# Patient Record
Sex: Female | Born: 1960
Health system: Southern US, Community
[De-identification: ages and names within clinical notes are randomized; demographics above are authoritative.]

## PROBLEM LIST (undated history)

## (undated) DIAGNOSIS — D219 Benign neoplasm of connective and other soft tissue, unspecified: Secondary | ICD-10-CM

## (undated) DIAGNOSIS — Z87442 Personal history of urinary calculi: Secondary | ICD-10-CM

## (undated) HISTORY — DX: Benign neoplasm of connective and other soft tissue, unspecified: D21.9

## (undated) HISTORY — DX: Personal history of urinary calculi: Z87.442

## (undated) HISTORY — PX: HAND SURGERY: SHX662

---

## 2002-09-28 ENCOUNTER — Other Ambulatory Visit: Admission: RE | Admit: 2002-09-28 | Discharge: 2002-09-28 | Payer: Self-pay | Admitting: Obstetrics and Gynecology

## 2004-05-17 ENCOUNTER — Other Ambulatory Visit: Admission: RE | Admit: 2004-05-17 | Discharge: 2004-05-17 | Payer: Self-pay | Admitting: Obstetrics and Gynecology

## 2008-10-03 ENCOUNTER — Ambulatory Visit: Payer: Self-pay | Admitting: Obstetrics and Gynecology

## 2008-10-03 ENCOUNTER — Encounter: Payer: Self-pay | Admitting: Obstetrics and Gynecology

## 2008-10-03 ENCOUNTER — Other Ambulatory Visit: Admission: RE | Admit: 2008-10-03 | Discharge: 2008-10-03 | Payer: Self-pay | Admitting: Obstetrics and Gynecology

## 2009-03-21 LAB — CONVERTED CEMR LAB: Pap Smear: NORMAL

## 2010-08-07 ENCOUNTER — Ambulatory Visit: Payer: Self-pay | Admitting: Internal Medicine

## 2010-08-07 DIAGNOSIS — F17200 Nicotine dependence, unspecified, uncomplicated: Secondary | ICD-10-CM | POA: Insufficient documentation

## 2010-08-07 DIAGNOSIS — R05 Cough: Secondary | ICD-10-CM

## 2010-08-07 DIAGNOSIS — R059 Cough, unspecified: Secondary | ICD-10-CM | POA: Insufficient documentation

## 2010-08-07 LAB — CONVERTED CEMR LAB
ALT: 28 units/L (ref 0–35)
Basophils Relative: 0.5 % (ref 0.0–3.0)
Bilirubin, Direct: 0.1 mg/dL (ref 0.0–0.3)
Chloride: 107 meq/L (ref 96–112)
Direct LDL: 133.6 mg/dL
Eosinophils Relative: 0.9 % (ref 0.0–5.0)
HCT: 40.2 % (ref 36.0–46.0)
Hemoglobin: 13.6 g/dL (ref 12.0–15.0)
Lymphs Abs: 1.5 10*3/uL (ref 0.7–4.0)
MCV: 87.5 fL (ref 78.0–100.0)
Monocytes Absolute: 0.5 10*3/uL (ref 0.1–1.0)
Potassium: 3.6 meq/L (ref 3.5–5.1)
RBC: 4.6 M/uL (ref 3.87–5.11)
TSH: 3.83 microintl units/mL (ref 0.35–5.50)
Total CHOL/HDL Ratio: 4
Total Protein: 7.1 g/dL (ref 6.0–8.3)
VLDL: 22.4 mg/dL (ref 0.0–40.0)
WBC: 8 10*3/uL (ref 4.5–10.5)

## 2010-08-08 ENCOUNTER — Telehealth: Payer: Self-pay | Admitting: Internal Medicine

## 2010-08-08 ENCOUNTER — Encounter: Payer: Self-pay | Admitting: Internal Medicine

## 2010-09-11 ENCOUNTER — Encounter: Payer: Self-pay | Admitting: Internal Medicine

## 2010-09-18 ENCOUNTER — Telehealth: Payer: Self-pay | Admitting: Internal Medicine

## 2010-10-01 ENCOUNTER — Encounter: Payer: Self-pay | Admitting: Internal Medicine

## 2010-11-22 NOTE — Letter (Signed)
Summary: [facility]   Imported By: Lester [place] 09/18/2010 12:41:18  _____________________________________________________________________  External Attachment:    Type:   Image     Comment:   External Document

## 2010-11-22 NOTE — Progress Notes (Signed)
Summary: RESULTS  Phone Note Call from Patient   Summary of Call: Patient is requesting results of cxr. Normal correct?  Initial call taken by: Lamar Sprinkles, CMA,  August 08, 2010 3:58 PM  Follow-up for Phone Call        yes Follow-up by: Etta Grandchild MD,  August 08, 2010 4:03 PM  Additional Follow-up for Phone Call Additional follow up Details #1::        Pt informed  Additional Follow-up by: Lamar Sprinkles, CMA,  August 09, 2010 12:11 PM

## 2010-11-22 NOTE — Assessment & Plan Note (Signed)
Summary: new bcbs pt phy-#--pkg---stc   Vital Signs:  Patient profile:   50 year old female Menstrual status:  regular LMP:     07/10/2010 Height:      65 inches Weight:      171 pounds BMI:     28.56 O2 Sat:      98 % on Room air Temp:     98.1 degrees F oral Pulse rate:   88 / minute Pulse rhythm:   regular Resp:     16 per minute BP sitting:   112 / 84  (left arm) Cuff size:   regular  Vitals Entered By: Lanier Prude, Beverly Gust) (August 07, 2010 9:06 AM)  Nutrition Counseling: Patient's BMI is greater than 25 and therefore counseled on weight management options.  O2 Flow:  Room air CC: Est PCP c/o dry cough and chest congestion X 2 wks, URI symptoms, Preventive Care Is Patient Diabetic? No LMP (date): 07/10/2010     Menstrual Status regular Enter LMP: 07/10/2010 Last PAP Result Normal   Primary Care Provider:  Yetta Barre  CC:  Est PCP c/o dry cough and chest congestion X 2 wks, URI symptoms, and Preventive Care.  History of Present Illness:  URI Symptoms      This is a 50 year old woman who presents with URI symptoms.  The symptoms began 2 weeks ago.  The severity is described as moderate.  The patient reports productive cough and sick contacts, but denies nasal congestion, clear nasal discharge, purulent nasal discharge, sore throat, dry cough, and earache.  The patient denies fever, stiff neck, dyspnea, wheezing, rash, vomiting, diarrhea, use of an antipyretic, and response to antipyretic.  The patient denies itchy throat, sneezing, seasonal symptoms, headache, muscle aches, and severe fatigue.  Risk factors for Strep sinusitis include double sickening.  The patient denies the following risk factors for Strep sinusitis: unilateral facial pain, unilateral nasal discharge, poor response to decongestant, tooth pain, Strep exposure, tender adenopathy, and absence of cough.    Preventive Screening-Counseling & Management  Alcohol-Tobacco     Alcohol drinks/day: 1  Alcohol type: wine     >5/day in last 3 mos: no     Alcohol Counseling: not indicated; use of alcohol is not excessive or problematic     Feels need to cut down: no     Feels annoyed by complaints: no     Feels guilty re: drinking: no     Needs 'eye opener' in am: no     Smoking Status: current     Smoking Cessation Counseling: yes     Smoke Cessation Stage: precontemplative     Packs/Day: <0.25     Year Started: 1984     Pack years: 10     Passive Smoke Exposure: yes     Tobacco Counseling: to quit use of tobacco products     Passive Smoke Counseling: to avoid passive smoke exposure  Hep-HIV-STD-Contraception     Hepatitis Risk: no risk noted     HIV Risk: no risk noted     STD Risk: no risk noted     Dental Visit-last 6 months yes     Dental Care Counseling: to seek dental care; no dental care within six months     SBE monthly: yes     SBE Education/Counseling: to perform regular SBE     Sun Exposure-Excessive: no     Sun Exposure Counseling: not indicated; sun exposure is acceptable  Safety-Violence-Falls     Seat  Belt Use: yes     Helmet Use: n/a     Firearms in the Home: firearms in the home     Firearm Counseling: to practice firearm safety     Smoke Detectors: yes     Violence in the Home: no risk noted     Sexual Abuse: no      Sexual History:  currently monogamous.        Drug Use:  never.        Blood Transfusions:  no.    Medications Prior to Update: 1)  None  Current Medications (verified): 1)  Cefuroxime Axetil 500 Mg Tabs (Cefuroxime Axetil) .... One By Mouth Two Times A Day For 10 Days  Allergies (verified): 1)  ! * Iv Contrast  Social History: Smoking Status:  current Risk analyst Use:  yes Packs/Day:  <0.25 Passive Smoke Exposure:  yes Hepatitis Risk:  no risk noted HIV Risk:  no risk noted STD Risk:  no risk noted Dental Care w/in 6 mos.:  yes Sun Exposure-Excessive:  no Sexual History:  currently monogamous Drug Use:  never Blood  Transfusions:  no  Review of Systems       The patient complains of weight gain.  The patient denies anorexia, fever, weight loss, hoarseness, chest pain, syncope, dyspnea on exertion, peripheral edema, headaches, hemoptysis, abdominal pain, melena, hematochezia, severe indigestion/heartburn, hematuria, muscle weakness, suspicious skin lesions, transient blindness, difficulty walking, depression, abnormal bleeding, enlarged lymph nodes, angioedema, and breast masses.    Physical Exam  General:  alert, well-developed, well-nourished, well-hydrated, appropriate dress, normal appearance, healthy-appearing, cooperative to examination, good hygiene, and overweight-appearing.   Head:  normocephalic, atraumatic, no abnormalities observed, and no abnormalities palpated.   Eyes:  vision grossly intact, pupils equal, pupils round, and pupils reactive to light.   Ears:  R ear normal and L ear normal.   Nose:  External nasal examination shows no deformity or inflammation. Nasal mucosa are pink and moist without lesions or exudates. Mouth:  Oral mucosa and oropharynx without lesions or exudates.  Teeth in good repair.no erythema, no exudates, no posterior lymphoid hypertrophy, no postnasal drip, no pharyngeal crowing, no lesions, no tongue abnormalities, no leukoplakia, and no petechiae.   Neck:  supple, full ROM, no masses, no thyromegaly, no thyroid nodules or tenderness, no JVD, normal carotid upstroke, no carotid bruits, no cervical lymphadenopathy, and no neck tenderness.   Lungs:  normal respiratory effort, no intercostal retractions, no accessory muscle use, normal breath sounds, no dullness, no fremitus, no crackles, and no wheezes.   Heart:  normal rate, regular rhythm, no murmur, no gallop, no rub, and no JVD.   Abdomen:  soft, non-tender, normal bowel sounds, no distention, no masses, no guarding, no rigidity, no rebound tenderness, no abdominal hernia, no inguinal hernia, no hepatomegaly, and no  splenomegaly.   Msk:  No deformity or scoliosis noted of thoracic or lumbar spine.   Pulses:  R and L carotid,radial,femoral,dorsalis pedis and posterior tibial pulses are full and equal bilaterally Extremities:  No clubbing, cyanosis, edema, or deformity noted with normal full range of motion of all joints.   Neurologic:  No cranial nerve deficits noted. Station and gait are normal. Plantar reflexes are down-going bilaterally. DTRs are symmetrical throughout. Sensory, motor and coordinative functions appear intact. Skin:  turgor normal, color normal, no rashes, no suspicious lesions, no ecchymoses, no petechiae, no purpura, no ulcerations, and no edema.   Cervical Nodes:  no anterior cervical adenopathy and no posterior  cervical adenopathy.   Axillary Nodes:  no R axillary adenopathy and no L axillary adenopathy.   Inguinal Nodes:  no R inguinal adenopathy and no L inguinal adenopathy.   Psych:  Cognition and judgment appear intact. Alert and cooperative with normal attention span and concentration. No apparent delusions, illusions, hallucinations   Impression & Recommendations:  Problem # 1:  ROUTINE GENERAL MEDICAL EXAM@HEALTH  CARE FACL (ICD-V70.0) Assessment New  Orders: Venipuncture (16109) TLB-Lipid Panel (80061-LIPID) TLB-BMP (Basic Metabolic Panel-BMET) (80048-METABOL) TLB-CBC Platelet - w/Differential (85025-CBCD) TLB-Hepatic/Liver Function Pnl (80076-HEPATIC) TLB-TSH (Thyroid Stimulating Hormone) (60454-UJW) Radiology Referral (Radiology)  Mammogram: normal (03/21/2010) Pap smear: Normal (05/08/2010) Td Booster: Tdap (08/07/2010)    Discussed using sunscreen, use of alcohol, drug use, self breast exam, routine dental care, routine eye care, schedule for GYN exam, routine physical exam, seat belts, multiple vitamins, osteoporosis prevention, adequate calcium intake in diet, recommendations for immunizations, mammograms and Pap smears.  Discussed exercise and checking  cholesterol.   Problem # 2:  BRONCHITIS-ACUTE (ICD-466.0) Assessment: New  Her updated medication list for this problem includes:    Cefuroxime Axetil 500 Mg Tabs (Cefuroxime axetil) ..... One by mouth two times a day for 10 days  Take antibiotics and other medications as directed. Encouraged to push clear liquids, get enough rest, and take acetaminophen as needed. To be seen in 5-7 days if no improvement, sooner if worse.  Problem # 3:  COUGH (ICD-786.2) Assessment: New will look for pna, mass, edema, etc. Orders: T-2 View CXR (71020TC)  Problem # 4:  TOBACCO USE (ICD-305.1) Assessment: New  Encouraged smoking cessation and discussed different methods for smoking cessation.   Complete Medication List: 1)  Cefuroxime Axetil 500 Mg Tabs (Cefuroxime axetil) .... One by mouth two times a day for 10 days  Other Orders: Tdap => 24yrs IM (11914) Admin 1st Vaccine (78295)  PAP Screening:    Hx Cervical Dysplasia in last 5 yrs? No    3 normal PAP smears in last 5 yrs? Yes    Last PAP smear:  05/08/2010    Reviewed PAP smear recommendations:  patient defers to GYN provider  PAP Smear Results:    Date of Exam:  05/08/2010    Results:  Normal  Mammogram Screening:    Last Mammogram:  03/21/2010    Reviewed Mammogram recommendations:  mammogram ordered  Osteoporosis Risk Assessment:  Risk Factors for Fracture or Low Bone Density:   Smoking status:       current  Immunization & Chemoprophylaxis:    Tetanus vaccine: Tdap  (08/07/2010)  Patient Instructions: 1)  Please schedule a follow-up appointment in 1 month. 2)  Tobacco is very bad for your health and your loved ones! You Should stop smoking!. 3)  Stop Smoking Tips: Choose a Quit date. Cut down before the Quit date. decide what you will do as a substitute when you feel the urge to smoke(gum,toothpick,exercise). 4)  It is important that you exercise regularly at least 20 minutes 5 times a week. If you develop chest pain,  have severe difficulty breathing, or feel very tired , stop exercising immediately and seek medical attention. 5)  You need to lose weight. Consider a lower calorie diet and regular exercise.  6)  Schedule your mammogram. 7)  You need to have a Pap Smear to prevent cervical cancer. 8)  Take calcium +Vitamin D daily. 9)  Take your antibiotic as prescribed until ALL of it is gone, but stop if you develop a rash or swelling and  contact our office as soon as possible. 10)  Acute bronchitis symptoms for less than 10 days are not helped by antibiotics. take over the counter cough medications. call if no improvment in  5-7 days, sooner if increasing cough, fever, or new symptoms( shortness of breath, chest pain). Prescriptions: CEFUROXIME AXETIL 500 MG TABS (CEFUROXIME AXETIL) One by mouth two times a day for 10 days  #20 x 1   Entered and Authorized by:   Etta Grandchild MD   Signed by:   Etta Grandchild MD on 08/07/2010   Method used:   Print then Give to Patient   RxID:   [id number]    Orders Added: 1)  T-2 View CXR [71020TC] 2)  Venipuncture [56213] 3)  TLB-Lipid Panel [80061-LIPID] 4)  TLB-BMP (Basic Metabolic Panel-BMET) [80048-METABOL] 5)  TLB-CBC Platelet - w/Differential [85025-CBCD] 6)  TLB-Hepatic/Liver Function Pnl [80076-HEPATIC] 7)  TLB-TSH (Thyroid Stimulating Hormone) [84443-TSH] 8)  Radiology Referral [Radiology] 9)  Tdap => 65yrs IM [90715] 10)  Admin 1st Vaccine [90471] 11)  New Patient 40-64 years [99386] 62)  New Patient Level IV [99204]   Immunizations Administered:  Tetanus Vaccine:    Vaccine Type: Tdap    Site: left deltoid    Mfr: GlaxoSmithKline    Dose: 0.5 ml    Route: IM    Given by: Lanier Prude, CMA(AAMA)    Exp. Date: 08/09/2012    Lot #: [id number]    VIS given: 09/07/08 version given August 07, 2010.   Immunizations Administered:  Tetanus Vaccine:    Vaccine Type: Tdap    Site: left deltoid    Mfr: GlaxoSmithKline    Dose: 0.5  ml    Route: IM    Given by: Lanier Prude, CMA(AAMA)    Exp. Date: 08/09/2012    Lot #: [id number]    VIS given: 09/07/08 version given August 07, 2010.   Preventive Care Screening  Mammogram:    Date:  03/21/2010    Results:  normal   Pap Smear:    Date:  03/21/2009    Results:  normal

## 2010-11-22 NOTE — Letter (Signed)
Summary: Lipid Letter  [facility]  [address] [place], [place] [address]   Phone: [phone]  Fax: [phone]    08/08/2010  Kathy Castro [address] [place], [place]  [address]  Dear Kathy Castro:  We have carefully reviewed your last lipid profile from  and the results are noted below with a summary of recommendations for lipid management.    Cholesterol:       206     Goal: <200   HDL "good" Cholesterol:   95.63     Goal: >40   LDL "bad" Cholesterol:   134     Goal: <130   Triglycerides:       112.0     Goal: <150    Chest Xray and other labs are normal    TLC Diet (Therapeutic Lifestyle Change): Saturated Fats & Transfatty acids should be kept < 7% of total calories ***Reduce Saturated Fats Polyunstaurated Fat can be up to 10% of total calories Monounsaturated Fat Fat can be up to 20% of total calories Total Fat should be no greater than 25-35% of total calories Carbohydrates should be 50-60% of total calories Protein should be approximately 15% of total calories Fiber should be at least 20-30 grams a day ***Increased fiber may help lower LDL Total Cholesterol should be < 200mg /day Consider adding plant stanol/sterols to diet (example: Benacol spread) ***A higher intake of unsaturated fat may reduce Triglycerides and Increase HDL    Adjunctive Measures (may lower LIPIDS and reduce risk of Heart Attack) include: Aerobic Exercise (20-30 minutes 3-4 times a week) Limit Alcohol Consumption Weight Reduction Aspirin 75-81 mg a day by mouth (if not allergic or contraindicated) Dietary Fiber 20-30 grams a day by mouth     Current Medications: 1)    Cefuroxime Axetil 500 Mg Tabs (Cefuroxime axetil) .... One by mouth two times a day for 10 days  If you have any questions, please call. We appreciate being able to work with you.   Sincerely,    [facility]

## 2010-11-22 NOTE — Progress Notes (Signed)
Summary: RESULTS -FYI  Phone Note Call from Patient Call back at Work Phone [phone]   Summary of Call: Patient is requesting a call back. She wants to know what her BP was at last office visit.  Initial call taken by: Lamar Sprinkles, CMA,  September 18, 2010 2:38 PM  Follow-up for Phone Call        Gave pt reading, pt's bp at health screening at wk was 138/80. Advised she continue to monitor and come in for office visit if bp is 140/90 or above. Follow-up by: Lamar Sprinkles, CMA,  September 18, 2010 5:55 PM

## 2011-04-02 LAB — HM PAP SMEAR: HM Pap smear: NORMAL

## 2011-04-09 LAB — HM MAMMOGRAPHY: HM Mammogram: NORMAL

## 2011-04-10 ENCOUNTER — Encounter: Payer: Self-pay | Admitting: Endocrinology

## 2011-06-13 ENCOUNTER — Encounter: Payer: Self-pay | Admitting: Gynecology

## 2011-06-25 ENCOUNTER — Encounter: Payer: Self-pay | Admitting: Obstetrics and Gynecology

## 2011-06-25 ENCOUNTER — Ambulatory Visit (INDEPENDENT_AMBULATORY_CARE_PROVIDER_SITE_OTHER): Payer: BC Managed Care – PPO | Admitting: Obstetrics and Gynecology

## 2011-06-25 ENCOUNTER — Other Ambulatory Visit (HOSPITAL_COMMUNITY)
Admission: RE | Admit: 2011-06-25 | Discharge: 2011-06-25 | Disposition: A | Discharge status: Home / Self Care | Payer: BC Managed Care – PPO | Source: Ambulatory Visit | Attending: Obstetrics and Gynecology | Admitting: Obstetrics and Gynecology

## 2011-06-25 VITALS — BP 116/80 | Ht 65.25 in | Wt 178.0 lb

## 2011-06-25 DIAGNOSIS — Z01419 Encounter for gynecological examination (general) (routine) without abnormal findings: Secondary | ICD-10-CM

## 2011-06-25 DIAGNOSIS — N949 Unspecified condition associated with female genital organs and menstrual cycle: Secondary | ICD-10-CM

## 2011-06-25 DIAGNOSIS — Z87442 Personal history of urinary calculi: Secondary | ICD-10-CM | POA: Insufficient documentation

## 2011-06-25 DIAGNOSIS — N938 Other specified abnormal uterine and vaginal bleeding: Secondary | ICD-10-CM

## 2011-06-25 DIAGNOSIS — R823 Hemoglobinuria: Secondary | ICD-10-CM

## 2011-06-25 NOTE — Progress Notes (Addendum)
Patient came to see me today for an annual GYN exam. She's had some stress over the past year with deaths of both her mother and father. During these periods of time her periods have gotten closer together. She's only had one cycle however that was less than 21 days. That  cycle was 18 days. She also had one cycle when she spotted, had her period, and in spotted some more. The entire length of bleeding that month was 18 days. Other than that she will just have several days of spotting before and after her period. She is up-to-date on mammograms. She sees Dr. Yetta Barre in [facility] and does her lab work with him. She has microscopic hematuria and kidney stones and sees [facility].  Physical examination: HEENT within normal limits. Neck: Thyroid not large. No masses. Supraclavicular nodes: not enlarged. Breasts: Examined in both sitting midline position. No skin changes and no masses. Abdomen: Soft no guarding rebound or masses or hernia. Pelvic: External: Within normal limits. BUS: Within normal limits. Vaginal:within normal limits. Good estrogen effect. No evidence of cystocele rectocele or enterocele. Cervix: clean. Uterus: enlarged by fibroids to 8-9 weeks size. Adnexa: No masses. Rectovaginal exam: Confirmatory and negative. Extremities: Within normal limits.  Assessment: 1. One episode DUB. 2. Kidney stones with microscopic hematuria. 3. Fibroids  Plan: 1. Continue yearly mammograms 2. Call me with any more DUB and we will do an SIH.

## 2011-06-25 NOTE — Patient Instructions (Signed)
Call me if length of bleeding increases.

## 2011-06-25 NOTE — Progress Notes (Signed)
Addended byCammie Mcgee T on: 06/25/2011 10:57 AM   Modules accepted: Orders

## 2011-08-09 ENCOUNTER — Ambulatory Visit (INDEPENDENT_AMBULATORY_CARE_PROVIDER_SITE_OTHER): Payer: BC Managed Care – PPO | Admitting: Internal Medicine

## 2011-08-09 ENCOUNTER — Encounter: Payer: Self-pay | Admitting: Internal Medicine

## 2011-08-09 ENCOUNTER — Other Ambulatory Visit (INDEPENDENT_AMBULATORY_CARE_PROVIDER_SITE_OTHER): Payer: BC Managed Care – PPO

## 2011-08-09 VITALS — BP 110/74 | HR 80 | Temp 97.8°F | Resp 16 | Wt 177.0 lb

## 2011-08-09 DIAGNOSIS — Z0000 Encounter for general adult medical examination without abnormal findings: Secondary | ICD-10-CM

## 2011-08-09 LAB — URINALYSIS, ROUTINE W REFLEX MICROSCOPIC
Bilirubin Urine: NEGATIVE
Ketones, ur: NEGATIVE
Leukocytes, UA: NEGATIVE
Nitrite: NEGATIVE

## 2011-08-09 LAB — LIPID PANEL: Cholesterol: 197 mg/dL (ref 0–200)

## 2011-08-09 LAB — CBC WITH DIFFERENTIAL/PLATELET
Basophils Absolute: 0 10*3/uL (ref 0.0–0.1)
Eosinophils Absolute: 0.1 10*3/uL (ref 0.0–0.7)
HCT: 42.1 % (ref 36.0–46.0)
Lymphs Abs: 1.4 10*3/uL (ref 0.7–4.0)
MCV: 89.1 fl (ref 78.0–100.0)
Monocytes Absolute: 0.6 10*3/uL (ref 0.1–1.0)
Monocytes Relative: 7.5 % (ref 3.0–12.0)
Platelets: 243 10*3/uL (ref 150.0–400.0)
RDW: 14.8 % — ABNORMAL HIGH (ref 11.5–14.6)

## 2011-08-09 LAB — COMPREHENSIVE METABOLIC PANEL
Albumin: 4.2 g/dL (ref 3.5–5.2)
BUN: 15 mg/dL (ref 6–23)
CO2: 27 mEq/L (ref 19–32)
Calcium: 8.9 mg/dL (ref 8.4–10.5)
Chloride: 107 mEq/L (ref 96–112)
Glucose, Bld: 93 mg/dL (ref 70–99)
Potassium: 4 mEq/L (ref 3.5–5.1)

## 2011-08-09 LAB — TSH: TSH: 3.5 u[IU]/mL (ref 0.35–5.50)

## 2011-08-09 NOTE — Patient Instructions (Signed)

## 2011-08-09 NOTE — Progress Notes (Signed)
  Subjective:    Patient ID: Kathy Castro, female    DOB: August 14, 1961, 50 y.o.   MRN: [id number]  HPI She returns for a complete physical and offers no complaints.    Review of Systems  Constitutional: Negative.   HENT: Negative.   Eyes: Negative.   Respiratory: Negative.   Cardiovascular: Negative.   Gastrointestinal: Negative.   Genitourinary: Negative.   Musculoskeletal: Negative.   Skin: Negative.   Neurological: Negative.   Hematological: Negative.   Psychiatric/Behavioral: Negative.        Objective:   Physical Exam  Vitals reviewed. Constitutional: She is oriented to person, place, and time. She appears well-developed. No distress.  HENT:  Head: Normocephalic and atraumatic.  Mouth/Throat: Oropharynx is clear and moist. No oropharyngeal exudate.  Eyes: Conjunctivae are normal. Right eye exhibits no discharge. Left eye exhibits no discharge. No scleral icterus.  Neck: Normal range of motion. Neck supple. No JVD present. No tracheal deviation present. No thyromegaly present.  Cardiovascular: Normal rate, regular rhythm, normal heart sounds and intact distal pulses.  Exam reveals no gallop and no friction rub.   No murmur heard. Pulmonary/Chest: Effort normal and breath sounds normal. No stridor. No respiratory distress. She has no wheezes. She has no rales. She exhibits no tenderness.  Abdominal: Soft. Bowel sounds are normal. She exhibits no distension and no mass. There is no tenderness. There is no rebound and no guarding.  Musculoskeletal: Normal range of motion. She exhibits no edema and no tenderness.  Lymphadenopathy:    She has no cervical adenopathy.  Neurological: She is oriented to person, place, and time. She displays normal reflexes. No cranial nerve deficit. She exhibits normal muscle tone. Coordination normal.  Skin: Skin is warm and dry. No rash noted. She is not diaphoretic. No erythema. No pallor.  Psychiatric: She has a normal mood and affect. Her  behavior is normal. Judgment and thought content normal.          Assessment & Plan:

## 2011-08-09 NOTE — Assessment & Plan Note (Signed)
Exam done, labs ordered, pt ed material was given 

## 2011-08-30 ENCOUNTER — Other Ambulatory Visit: Payer: Self-pay | Admitting: Chiropractic Medicine

## 2011-08-30 ENCOUNTER — Ambulatory Visit
Admission: RE | Admit: 2011-08-30 | Discharge: 2011-08-30 | Disposition: A | Discharge status: Home / Self Care | Payer: BC Managed Care – PPO | Source: Ambulatory Visit | Attending: Chiropractic Medicine | Admitting: Chiropractic Medicine

## 2011-08-30 DIAGNOSIS — M5416 Radiculopathy, lumbar region: Secondary | ICD-10-CM

## 2011-08-30 DIAGNOSIS — M542 Cervicalgia: Secondary | ICD-10-CM

## 2011-08-30 DIAGNOSIS — M5412 Radiculopathy, cervical region: Secondary | ICD-10-CM

## 2011-11-21 ENCOUNTER — Encounter: Payer: Self-pay | Admitting: Internal Medicine

## 2011-11-21 ENCOUNTER — Ambulatory Visit (INDEPENDENT_AMBULATORY_CARE_PROVIDER_SITE_OTHER): Payer: BC Managed Care – PPO | Admitting: Internal Medicine

## 2011-11-21 VITALS — BP 120/78 | HR 95 | Temp 97.8°F | Resp 16 | Wt 179.2 lb

## 2011-11-21 DIAGNOSIS — Z0000 Encounter for general adult medical examination without abnormal findings: Secondary | ICD-10-CM

## 2011-11-21 DIAGNOSIS — J019 Acute sinusitis, unspecified: Secondary | ICD-10-CM

## 2011-11-21 MED ORDER — AMOXICILLIN-POT CLAVULANATE 500-125 MG PO TABS: 1.0000 | ORAL_TABLET | Freq: Three times a day (TID) | ORAL | Status: AC

## 2011-11-21 NOTE — Assessment & Plan Note (Signed)
Start augmentin for the infection 

## 2011-11-21 NOTE — Assessment & Plan Note (Signed)
She will use nicotine patches to try to quit smoking

## 2011-11-21 NOTE — Patient Instructions (Signed)

## 2011-11-21 NOTE — Progress Notes (Signed)
Subjective:    Patient ID: Kathy Castro, female    DOB: 1960-12-16, 51 y.o.   MRN: [id number]  Cough This is a new problem. The current episode started in the past 7 days. The problem has been gradually worsening. The problem occurs constantly. The cough is productive of purulent sputum. Associated symptoms include chills, rhinorrhea and a sore throat. Pertinent negatives include no chest pain, ear congestion, ear pain, fever, headaches, heartburn, hemoptysis, myalgias, nasal congestion, postnasal drip, rash, shortness of breath, sweats, weight loss or wheezing. The symptoms are aggravated by nothing. Risk factors for lung disease include smoking/tobacco exposure. She has tried nothing for the symptoms.      Review of Systems  Constitutional: Positive for chills. Negative for fever, weight loss, diaphoresis, activity change, appetite change, fatigue and unexpected weight change.  HENT: Positive for sore throat, rhinorrhea and sinus pressure. Negative for ear pain, facial swelling, sneezing, trouble swallowing, neck pain, neck stiffness, dental problem, voice change and postnasal drip.   Eyes: Negative.   Respiratory: Positive for cough. Negative for apnea, hemoptysis, choking, chest tightness, shortness of breath, wheezing and stridor.   Cardiovascular: Negative for chest pain, palpitations and leg swelling.  Gastrointestinal: Negative for heartburn, nausea, vomiting, abdominal pain, diarrhea, constipation, blood in stool, abdominal distention, anal bleeding and rectal pain.  Genitourinary: Negative.   Musculoskeletal: Negative for myalgias, back pain, joint swelling, arthralgias and gait problem.  Skin: Negative for color change, pallor, rash and wound.  Neurological: Negative.  Negative for headaches.  Hematological: Negative for adenopathy. Does not bruise/bleed easily.  Psychiatric/Behavioral: Negative.        Objective:   Physical Exam  Vitals reviewed. Constitutional: She is  oriented to person, place, and time. She appears well-developed and well-nourished. No distress.  HENT:  Head: Normocephalic and atraumatic.  Mouth/Throat: Oropharynx is clear and moist. No oropharyngeal exudate.  Eyes: Conjunctivae are normal. Right eye exhibits no discharge. Left eye exhibits no discharge. No scleral icterus.  Neck: Normal range of motion. Neck supple. No JVD present. No tracheal deviation present. No thyromegaly present.  Cardiovascular: Normal rate, regular rhythm, normal heart sounds and intact distal pulses.  Exam reveals no gallop.   No murmur heard. Pulmonary/Chest: Effort normal and breath sounds normal. No stridor. No respiratory distress. She has no wheezes. She has no rales. She exhibits no tenderness.  Abdominal: Soft. Bowel sounds are normal. She exhibits no distension and no mass. There is no tenderness. There is no rebound and no guarding.  Musculoskeletal: Normal range of motion. She exhibits no edema and no tenderness.  Lymphadenopathy:    She has no cervical adenopathy.  Neurological: She is oriented to person, place, and time.  Skin: Skin is warm and dry. No rash noted. She is not diaphoretic. No erythema. No pallor.  Psychiatric: She has a normal mood and affect. Her behavior is normal. Judgment and thought content normal.      Lab Results  Component Value Date   WBC 7.4 08/09/2011   HGB 14.2 08/09/2011   HCT 42.1 08/09/2011   PLT 243.0 08/09/2011   GLUCOSE 93 08/09/2011   CHOL 197 08/09/2011   TRIG 68.0 08/09/2011   HDL 57.20 08/09/2011   LDLDIRECT 133.6 08/07/2010   LDLCALC 126* 08/09/2011   ALT 18 08/09/2011   AST 18 08/09/2011   NA 143 08/09/2011   K 4.0 08/09/2011   CL 107 08/09/2011   CREATININE 0.8 08/09/2011   BUN 15 08/09/2011   CO2 27 08/09/2011   TSH  3.50 08/09/2011      Assessment & Plan:

## 2011-12-11 ENCOUNTER — Telehealth: Payer: Self-pay

## 2011-12-11 MED ORDER — FLUCONAZOLE 150 MG PO TABS: 150.0000 mg | ORAL_TABLET | Freq: Once | ORAL | Status: AC

## 2011-12-11 NOTE — Telephone Encounter (Signed)
Pt called requesting Rx for yeast infection.

## 2011-12-11 NOTE — Telephone Encounter (Signed)
Pt says she has been using OTC meds with no help. Pt was seen 01/31 and Rxd ABX that caused infection, please advise.

## 2011-12-11 NOTE — Telephone Encounter (Signed)
Pls use an OTC cream. OV if issues Thx

## 2011-12-11 NOTE — Telephone Encounter (Signed)
Ok Diflucan - done [employer]

## 2011-12-12 NOTE — Telephone Encounter (Signed)
Pt informed of Rx/pharmacy 

## 2012-04-07 LAB — HM MAMMOGRAPHY: HM Mammogram: NORMAL

## 2014-02-15 ENCOUNTER — Encounter: Payer: Self-pay | Admitting: Women's Health

## 2014-02-15 ENCOUNTER — Ambulatory Visit (INDEPENDENT_AMBULATORY_CARE_PROVIDER_SITE_OTHER): Payer: BC Managed Care – PPO | Admitting: Women's Health

## 2014-02-15 VITALS — BP 120/80 | Ht 65.0 in | Wt 186.0 lb

## 2014-02-15 DIAGNOSIS — B373 Candidiasis of vulva and vagina: Secondary | ICD-10-CM

## 2014-02-15 DIAGNOSIS — N949 Unspecified condition associated with female genital organs and menstrual cycle: Secondary | ICD-10-CM

## 2014-02-15 DIAGNOSIS — B3731 Acute candidiasis of vulva and vagina: Secondary | ICD-10-CM

## 2014-02-15 DIAGNOSIS — F17200 Nicotine dependence, unspecified, uncomplicated: Secondary | ICD-10-CM

## 2014-02-15 DIAGNOSIS — N938 Other specified abnormal uterine and vaginal bleeding: Secondary | ICD-10-CM

## 2014-02-15 MED ORDER — TERCONAZOLE 0.4 % VA CREA: 1.0000 | TOPICAL_CREAM | Freq: Every day | VAGINAL | Status: DC

## 2014-02-15 MED ORDER — MEGESTROL ACETATE 40 MG PO TABS
40.0000 mg | ORAL_TABLET | Freq: Two times a day (BID) | ORAL | Status: DC
Start: 2014-02-15 — End: 2014-03-03

## 2014-02-15 NOTE — Progress Notes (Signed)
Patient ID: Kathy Castro, female   DOB: 01-Feb-1961, 53 y.o.   MRN: [id number] Presents with complaint of irregular heavy bleeding. History of regular 3-4 day monthly cycle until 2010, then cycles became more irregular every 2-3 months some months heavy. Minimal cramping. LMP 05/2013 for 1 week.  Bleeding since the beginning of March stopped for 10 days, started again, most days bleeding heavy changing tampon every hour. Bleeding slowed yesterday,  today much less. Contraceptives vasectomy/same partner. Denies vertigo, urinary symptoms, vaginal discharge, abdominal pain. Minimal menopausal symptoms.  Exam: Appears well. External genitalia within normal limits, speculum exam cervix without visible lesion or polyp, no erythema blood noted. Bimanual no CMT or adnexal fullness or tenderness.  Perimenopausal DUB  Plan: FSH, TSH, prolactin. Megace 40 twice daily if heavy bleeding starts prior to annual exam scheduled in May. Reviewed sonohysterogram if bleeding starts again.

## 2014-02-15 NOTE — Assessment & Plan Note (Signed)
Quit 2013

## 2014-02-15 NOTE — Patient Instructions (Signed)
Menorrhagia  Menorrhagia is the medical term for when your menstrual periods are heavy or last longer than usual. With menorrhagia, every period you have may cause enough blood loss and cramping that you are unable to maintain your usual activities.  CAUSES   In some cases, the cause of heavy periods is unknown, but a number of conditions may cause menorrhagia. Common causes include:  · A problem with the hormone-producing thyroid gland (hypothyroid).  · Noncancerous growths in the uterus (polyps or fibroids).  · An imbalance of the estrogen and progesterone hormones.  · One of your ovaries not releasing an egg during one or more months.  · Side effects of having an intrauterine device (IUD).  · Side effects of some medicines, such as anti-inflammatory medicines or blood thinners.  · A bleeding disorder that stops your blood from clotting normally.  SIGNS AND SYMPTOMS   During a normal period, bleeding lasts between 4 and 8 days. Signs that your periods are too heavy include:  · You routinely have to change your pad or tampon every 1 or 2 hours because it is completely soaked.  · You pass blood clots larger than 1 inch (2.5 cm) in size.  · You have bleeding for more than 7 days.  · You need to use pads and tampons at the same time because of heavy bleeding.  · You need to wake up to change your pads or tampons during the night.  · You have symptoms of anemia, such as tiredness, fatigue, or shortness of breath.   DIAGNOSIS   Your health care provider will perform a physical exam and ask you questions about your symptoms and menstrual history. Other tests may be ordered based on what the health care provider finds during the exam. These tests can include:  · Blood tests To check if you are pregnant or have hormonal changes, a bleeding or thyroid disorder, low iron levels (anemia), or other problems.  · Endometrial biopsy Your health care provider takes a sample of tissue from the inside of your uterus to be examined  under a microscope.  · Pelvic ultrasound This test uses sound waves to make a picture of your uterus, ovaries, and vagina. The pictures can show if you have fibroids or other growths.  · Hysteroscopy For this test, your health care provider will use a small telescope to look inside your uterus.  Based on the results of your initial tests, your health care provider may recommend further testing.  TREATMENT   Treatment may not be needed. If it is needed, your health care provider may recommend treatment with one or more medicines first. If these do not reduce bleeding enough, a surgical treatment might be an option. The best treatment for you will depend on:   · Whether you need to prevent pregnancy.    · Your desire to have children in the future.  · The cause and severity of your bleeding.  · Your opinion and personal preference.    Medicines for menorrhagia may include:  · Birth control methods that use hormones These include the pill, skin patch, vaginal ring, shots that you get every 3 months, hormonal IUD, and implant. These treatments reduce bleeding during your menstrual period.  · Medicines that thicken blood and slow bleeding.  · Medicines that reduce swelling, such as ibuprofen.   · Medicines that contain a synthetic hormone called progestin.    · Medicines that make the ovaries stop working for a short time.    You may need surgical   treatment for menorrhagia if the medicines are unsuccessful. Treatment options include:  · Dilation and curettage (D&C) In this procedure, your health care provider opens (dilates) your cervix and then scrapes or suctions tissue from the lining of your uterus to reduce menstrual bleeding.  · Operative hysteroscopy This procedure uses a tiny tube with a light (hysteroscope) to view your uterine cavity and can help in the surgical removal of a polyp that may be causing heavy periods.  · Endometrial ablation Through various techniques, your health care provider permanently  destroys the entire lining of your uterus (endometrium). After endometrial ablation, most women have little or no menstrual flow. Endometrial ablation reduces your ability to become pregnant.  · Endometrial resection This surgical procedure uses an electrosurgical wire loop to remove the lining of the uterus. This procedure also reduces your ability to become pregnant.  · Hysterectomy Surgical removal of the uterus and cervix is a permanent procedure that stops menstrual periods. Pregnancy is not possible after a hysterectomy. This procedure requires anesthesia and hospitalization.  HOME CARE INSTRUCTIONS   · Only take over-the-counter or prescription medicines as directed by your health care provider. Take prescribed medicines exactly as directed. Do not change or switch medicines without consulting your health care provider.  · Take any prescribed iron pills exactly as directed by your health care provider. Long-term heavy bleeding may result in low iron levels. Iron pills help replace the iron your body lost from heavy bleeding. Iron may cause constipation. If this becomes a problem, increase the bran, fruits, and roughage in your diet.  · Do not take aspirin or medicines that contain aspirin 1 week before or during your menstrual period. Aspirin may make the bleeding worse.  · If you need to change your sanitary pad or tampon more than once every 2 hours, stay in bed and rest as much as possible until the bleeding stops.  · Eat well-balanced meals. Eat foods high in iron. Examples are leafy green vegetables, meat, liver, eggs, and whole grain breads and cereals. Do not try to lose weight until the abnormal bleeding has stopped and your blood iron level is back to normal.  SEEK MEDICAL CARE IF:   · You soak through a pad or tampon every 1 or 2 hours, and this happens every time you have a period.  · You need to use pads and tampons at the same time because you are bleeding so much.  · You need to change your pad  or tampon during the night.  · You have a period that lasts for more than 8 days.  · You pass clots bigger than 1 inch wide.  · You have irregular periods that happen more or less often than once a month.  · You feel dizzy or faint.  · You feel very weak or tired.  · You feel short of breath or feel your heart is beating too fast when you exercise.  · You have nausea and vomiting or diarrhea while you are taking your medicine.  · You have any problems that may be related to the medicine you are taking.  SEEK IMMEDIATE MEDICAL CARE IF:   · You soak through 4 or more pads or tampons in 2 hours.  · You have any bleeding while you are pregnant.  MAKE SURE YOU:   · Understand these instructions.  · Will watch your condition.  · Will get help right away if you are not doing well or get worse.    Document Released: 10/07/2005 Document Revised: 07/28/2013 Document Reviewed: 03/28/2013  Exit[employer]® Patient Information ©2014 [employer].

## 2014-02-16 LAB — TSH: TSH: 2.821 u[IU]/mL (ref 0.350–4.500)

## 2014-02-16 LAB — PROLACTIN: Prolactin: 11.5 ng/mL

## 2014-02-16 LAB — FOLLICLE STIMULATING HORMONE: FSH: 12.8 m[IU]/mL

## 2014-03-03 ENCOUNTER — Ambulatory Visit (INDEPENDENT_AMBULATORY_CARE_PROVIDER_SITE_OTHER): Payer: BC Managed Care – PPO | Admitting: Women's Health

## 2014-03-03 ENCOUNTER — Encounter: Payer: Self-pay | Admitting: Women's Health

## 2014-03-03 ENCOUNTER — Other Ambulatory Visit (HOSPITAL_COMMUNITY)
Admission: RE | Admit: 2014-03-03 | Discharge: 2014-03-03 | Disposition: A | Discharge status: Home / Self Care | Payer: BC Managed Care – PPO | Source: Ambulatory Visit | Attending: Women's Health | Admitting: Women's Health

## 2014-03-03 VITALS — BP 116/72 | Ht 65.0 in | Wt 186.0 lb

## 2014-03-03 DIAGNOSIS — Z01419 Encounter for gynecological examination (general) (routine) without abnormal findings: Secondary | ICD-10-CM

## 2014-03-03 DIAGNOSIS — F17200 Nicotine dependence, unspecified, uncomplicated: Secondary | ICD-10-CM

## 2014-03-03 DIAGNOSIS — Z13220 Encounter for screening for lipoid disorders: Secondary | ICD-10-CM

## 2014-03-03 DIAGNOSIS — Z1151 Encounter for screening for human papillomavirus (HPV): Secondary | ICD-10-CM | POA: Insufficient documentation

## 2014-03-03 DIAGNOSIS — N912 Amenorrhea, unspecified: Secondary | ICD-10-CM

## 2014-03-03 LAB — COMPREHENSIVE METABOLIC PANEL
ALBUMIN: 4.3 g/dL (ref 3.5–5.2)
ALT: 14 U/L (ref 0–35)
AST: 16 U/L (ref 0–37)
Alkaline Phosphatase: 98 U/L (ref 39–117)
BUN: 14 mg/dL (ref 6–23)
CALCIUM: 9.5 mg/dL (ref 8.4–10.5)
CHLORIDE: 103 meq/L (ref 96–112)
CO2: 26 meq/L (ref 19–32)
CREATININE: 0.75 mg/dL (ref 0.50–1.10)
Glucose, Bld: 99 mg/dL (ref 70–99)
POTASSIUM: 4 meq/L (ref 3.5–5.3)
Sodium: 136 mEq/L (ref 135–145)
TOTAL PROTEIN: 6.9 g/dL (ref 6.0–8.3)
Total Bilirubin: 0.3 mg/dL (ref 0.2–1.2)

## 2014-03-03 LAB — CBC WITH DIFFERENTIAL/PLATELET
Basophils Absolute: 0 10*3/uL (ref 0.0–0.1)
Basophils Relative: 0 % (ref 0–1)
EOS PCT: 2 % (ref 0–5)
Eosinophils Absolute: 0.1 10*3/uL (ref 0.0–0.7)
HEMATOCRIT: 39.5 % (ref 36.0–46.0)
Hemoglobin: 13.5 g/dL (ref 12.0–15.0)
LYMPHS ABS: 1.5 10*3/uL (ref 0.7–4.0)
LYMPHS PCT: 23 % (ref 12–46)
MCH: 30.6 pg (ref 26.0–34.0)
MCHC: 34.2 g/dL (ref 30.0–36.0)
MCV: 89.6 fL (ref 78.0–100.0)
MONO ABS: 0.7 10*3/uL (ref 0.1–1.0)
Monocytes Relative: 10 % (ref 3–12)
NEUTROS ABS: 4.2 10*3/uL (ref 1.7–7.7)
Neutrophils Relative %: 65 % (ref 43–77)
PLATELETS: 316 10*3/uL (ref 150–400)
RBC: 4.41 MIL/uL (ref 3.87–5.11)
RDW: 13.5 % (ref 11.5–15.5)
WBC: 6.5 10*3/uL (ref 4.0–10.5)

## 2014-03-03 LAB — LIPID PANEL
CHOL/HDL RATIO: 4.1 ratio
CHOLESTEROL: 207 mg/dL — AB (ref 0–200)
HDL: 50 mg/dL (ref >39)
LDL Cholesterol: 119 mg/dL — ABNORMAL HIGH (ref 0–99)
Triglycerides: 189 mg/dL — ABNORMAL HIGH (ref <150)
VLDL: 38 mg/dL (ref 0–40)

## 2014-03-03 NOTE — Patient Instructions (Signed)
Health Recommendations for Postmenopausal Women Respected and ongoing research has looked at the most common causes of death, disability, and poor quality of life in postmenopausal women. The causes include heart disease, diseases of blood vessels, diabetes, depression, cancer, and bone loss (osteoporosis). Many things can be done to help lower the chances of developing these and other common problems: CARDIOVASCULAR DISEASE Heart Disease: A heart attack is a medical emergency. Know the signs and symptoms of a heart attack. Below are things women can do to reduce their risk for heart disease.   Do not smoke. If you smoke, quit.  Aim for a healthy weight. Being overweight causes many preventable deaths. Eat a healthy and balanced diet and drink an adequate amount of liquids.  Get moving. Make a commitment to be more physically active. Aim for 30 minutes of activity on most, if not all days of the week.  Eat for heart health. Choose a diet that is low in saturated fat and cholesterol and eliminate trans fat. Include whole grains, vegetables, and fruits. Read and understand the labels on food containers before buying.  Know your numbers. Ask your caregiver to check your blood pressure, cholesterol (total, HDL, LDL, triglycerides) and blood glucose. Work with your caregiver on improving your entire clinical picture.  High blood pressure. Limit or stop your table salt intake (try salt substitute and food seasonings). Avoid salty foods and drinks. Read labels on food containers before buying. Eating well and exercising can help control high blood pressure. STROKE  Stroke is a medical emergency. Stroke may be the result of a blood clot in a blood vessel in the brain or by a brain hemorrhage (bleeding). Know the signs and symptoms of a stroke. To lower the risk of developing a stroke:  Avoid fatty foods.  Quit smoking.  Control your diabetes, blood pressure, and irregular heart rate. THROMBOPHLEBITIS  (BLOOD CLOT) OF THE LEG  Becoming overweight and leading a stationary lifestyle may also contribute to developing blood clots. Controlling your diet and exercising will help lower the risk of developing blood clots. CANCER SCREENING  Breast Cancer: Take steps to reduce your risk of breast cancer.  You should practice "breast self-awareness." This means understanding the normal appearance and feel of your breasts and should include breast self-examination. Any changes detected, no matter how small, should be reported to your caregiver.  After age 40, you should have a clinical breast exam (CBE) every year.  Starting at age 40, you should consider having a mammogram (breast X-ray) every year.  If you have a family history of breast cancer, talk to your caregiver about genetic screening.  If you are at high risk for breast cancer, talk to your caregiver about having an MRI and a mammogram every year.  Intestinal or Stomach Cancer: Tests to consider are a rectal exam, fecal occult blood, sigmoidoscopy, and colonoscopy. Women who are high risk may need to be screened at an earlier age and more often.  Cervical Cancer:  Beginning at age 30, you should have a Pap test every 3 years as long as the past 3 Pap tests have been normal.  If you have had past treatment for cervical cancer or a condition that could lead to cancer, you need Pap tests and screening for cancer for at least 20 years after your treatment.  If you had a hysterectomy for a problem that was not cancer or a condition that could lead to cancer, then you no longer need Pap tests.    If you are between ages 65 and 70, and you have had normal Pap tests going back 10 years, you no longer need Pap tests.  If Pap tests have been discontinued, risk factors (such as a new sexual partner) need to be reassessed to determine if screening should be resumed.  Some medical problems can increase the chance of getting cervical cancer. In these  cases, your caregiver may recommend more frequent screening and Pap tests.  Uterine Cancer: If you have vaginal bleeding after reaching menopause, you should notify your caregiver.  Ovarian cancer: Other than yearly pelvic exams, there are no reliable tests available to screen for ovarian cancer at this time except for yearly pelvic exams.  Lung Cancer: Yearly chest X-rays can detect lung cancer and should be done on high risk women, such as cigarette smokers and women with chronic lung disease (emphysema).  Skin Cancer: A complete body skin exam should be done at your yearly examination. Avoid overexposure to the sun and ultraviolet light lamps. Use a strong sun block cream when in the sun. All of these things are important in lowering the risk of skin cancer. MENOPAUSE Menopause Symptoms: Hormone therapy products are effective for treating symptoms associated with menopause:  Moderate to severe hot flashes.  Night sweats.  Mood swings.  Headaches.  Tiredness.  Loss of sex drive.  Insomnia.  Other symptoms. Hormone replacement carries certain risks, especially in older women. Women who use or are thinking about using estrogen or estrogen with progestin treatments should discuss that with their caregiver. Your caregiver will help you understand the benefits and risks. The ideal dose of hormone replacement therapy is not known. The Food and Drug Administration (FDA) has concluded that hormone therapy should be used only at the lowest doses and for the shortest amount of time to reach treatment goals.  OSTEOPOROSIS Protecting Against Bone Loss and Preventing Fracture: If you use hormone therapy for prevention of bone loss (osteoporosis), the risks for bone loss must outweigh the risk of the therapy. Ask your caregiver about other medications known to be safe and effective for preventing bone loss and fractures. To guard against bone loss or fractures, the following is recommended:  If  you are less than age 50, take 1000 mg of calcium and at least 600 mg of Vitamin D per day.  If you are greater than age 50 but less than age 70, take 1200 mg of calcium and at least 600 mg of Vitamin D per day.  If you are greater than age 70, take 1200 mg of calcium and at least 800 mg of Vitamin D per day. Smoking and excessive alcohol intake increases the risk of osteoporosis. Eat foods rich in calcium and vitamin D and do weight bearing exercises several times a week as your caregiver suggests. DIABETES Diabetes Melitus: If you have Type I or Type 2 diabetes, you should keep your blood sugar under control with diet, exercise and recommended medication. Avoid too many sweets, starchy and fatty foods. Being overweight can make control more difficult. COGNITION AND MEMORY Cognition and Memory: Menopausal hormone therapy is not recommended for the prevention of cognitive disorders such as Alzheimer's disease or memory loss.  DEPRESSION  Depression may occur at any age, but is common in elderly women. The reasons may be because of physical, medical, social (loneliness), or financial problems and needs. If you are experiencing depression because of medical problems and control of symptoms, talk to your caregiver about this. Physical activity and   exercise may help with mood and sleep. Community and volunteer involvement may help your sense of value and worth. If you have depression and you feel that the problem is getting worse or becoming severe, talk to your caregiver about treatment options that are best for you. ACCIDENTS  Accidents are common and can be serious in the elderly woman. Prepare your house to prevent accidents. Eliminate throw rugs, place hand bars in the bath, shower and toilet areas. Avoid wearing high heeled shoes or walking on wet, snowy, and icy areas. Limit or stop driving if you have vision or hearing problems, or you feel you are unsteady with you movements and  reflexes. HEPATITIS C Hepatitis C is a type of viral infection affecting the liver. It is spread mainly through contact with blood from an infected person. It can be treated, but if left untreated, it can lead to severe liver damage over years. Many people who are infected do not know that the virus is in their blood. If you are a "baby-boomer", it is recommended that you have one screening test for Hepatitis C. IMMUNIZATIONS  Several immunizations are important to consider having during your senior years, including:   Tetanus, diptheria, and pertussis booster shot.  Influenza every year before the flu season begins.  Pneumonia vaccine.  Shingles vaccine.  Others as indicated based on your specific needs. Talk to your caregiver about these. Document Released: 11/29/2005 Document Revised: 09/23/2012 Document Reviewed: 07/25/2008 Exit[employer] Patient Information 2014 [employer].  

## 2014-03-03 NOTE — Progress Notes (Signed)
Kathy Castro 07-Apr-1961 [id number]    History:    Presents for annual exam.  Irregular cycles past year 05/2013 and 12/2013/vasectomy. Was seen 12/2013 for DUB prescribed Megace, states did not take bleeding stopped after office visit. History of regular cycles until 2010, then cycled every 2-3 months, until 05/2013. Normal Pap and mammogram history. [place] 12 12/2013.  Past medical history, past surgical history, family history and social history were all reviewed and documented in the EPIC chart. Daughter and son in their 20's doing well. Desk job, doing crossfit 3 times weekly.  ROS:  A  12 point ROS was performed and pertinent positives and negatives are included.  Exam:  Filed Vitals:   03/03/14 1207  BP: 116/72    General appearance:  Normal Thyroid:  Symmetrical, normal in size, without palpable masses or nodularity. Respiratory  Auscultation:  Clear without wheezing or rhonchi Cardiovascular  Auscultation:  Regular rate, without rubs, murmurs or gallops  Edema/varicosities:  Not grossly evident Abdominal  Soft,nontender, without masses, guarding or rebound.  Liver/spleen:  No organomegaly noted  Hernia:  None appreciated  Skin  Inspection:  Grossly normal   Breasts: Examined lying and sitting.     Right: Without masses, retractions, discharge or axillary adenopathy.     Left: Without masses, retractions, discharge or axillary adenopathy. Gentitourinary   Inguinal/mons:  Normal without inguinal adenopathy  External genitalia:  Normal  BUS/Urethra/Skene's glands:  Normal  Vagina:  Normal  Cervix:  Normal  Uterus:  normal in size, shape and contour.  Midline and mobile  Adnexa/parametria:     Rt: Without masses or tenderness.   Lt: Without masses or tenderness.  Anus and perineum: Normal  Digital rectal exam: Normal sphincter tone without palpated masses or tenderness  Assessment/Plan:  53 y.o. MWF G2P2 for annual exam.    Perimenopausal/vasectomy  Plan: SBE's,  reviewed importance of annual mammogram, instructed to schedule. Continue regular exercise, calcium rich diet, vitamin D 2000 daily encouraged. Has not had a screening colonoscopy, reviewed importance, instructed to schedule. CBC, comprehensive metabolic panel, lipid panel, TSH, FSH, UA, Pap with HR HPV typing. Normal Pap 2012, new screening guidelines reviewed. Continue annual dermatology skin check.  Note: This dictation was prepared with Dragon/digital dictation.  Any transcriptional errors that result are unintentional. Huel Cote [facility], 12:50 PM 03/03/2014

## 2014-03-04 ENCOUNTER — Encounter: Payer: Self-pay | Admitting: Obstetrics and Gynecology

## 2014-03-04 LAB — URINALYSIS W MICROSCOPIC + REFLEX CULTURE
BILIRUBIN URINE: NEGATIVE
Bacteria, UA: NONE SEEN
CASTS: NONE SEEN
CRYSTALS: NONE SEEN
GLUCOSE, UA: NEGATIVE mg/dL
Hgb urine dipstick: NEGATIVE
Ketones, ur: NEGATIVE mg/dL
LEUKOCYTES UA: NEGATIVE
Nitrite: NEGATIVE
PH: 7.5 (ref 5.0–8.0)
Protein, ur: NEGATIVE mg/dL
SPECIFIC GRAVITY, URINE: 1.024 (ref 1.005–1.030)
SQUAMOUS EPITHELIAL / LPF: NONE SEEN
Urobilinogen, UA: 0.2 mg/dL (ref 0.0–1.0)

## 2014-03-04 LAB — FOLLICLE STIMULATING HORMONE: FSH: 11.6 m[IU]/mL

## 2014-03-04 LAB — TSH: TSH: 2.383 u[IU]/mL (ref 0.350–4.500)

## 2014-03-16 ENCOUNTER — Telehealth: Payer: Self-pay | Admitting: *Deleted

## 2014-03-16 NOTE — Telephone Encounter (Signed)
Pt called c/o heavy bleeding LMP:03/10/14 had some megace 40 left from OV 02/15/14 is taking 1 po daily pt said you told her if bleeding started again to do this. C/o bad cramping with this cycle as well. Pt would like recommendations. Please advise

## 2014-03-17 NOTE — Telephone Encounter (Signed)
Kathy see below note

## 2014-03-17 NOTE — Telephone Encounter (Signed)
Message left

## 2014-03-18 NOTE — Telephone Encounter (Signed)
Telephone call, states bleeding is less, no bleeding between cycles, last cycle one month ago, started Megace day 7 of cycle for fear of it lasting longer than 7 days, instructed to call if bleeding does not stop.

## 2014-03-24 ENCOUNTER — Telehealth: Payer: Self-pay | Admitting: *Deleted

## 2014-03-24 ENCOUNTER — Other Ambulatory Visit: Payer: Self-pay | Admitting: Women's Health

## 2014-03-24 DIAGNOSIS — N938 Other specified abnormal uterine and vaginal bleeding: Secondary | ICD-10-CM

## 2014-03-24 NOTE — Telephone Encounter (Signed)
Telephone call, continues to have longer than 7 day cycles with spotting, will have ultrasound to check endometrial lining thickness.

## 2014-03-24 NOTE — Telephone Encounter (Signed)
Pt called to follow up from telephone conversation 03/16/14 took megace on Friday, Saturday and Sunday, only spotting wearing pad that she can wear all day without changing. Would like to know if she should continue megace since still spotting? Please advise

## 2014-04-01 ENCOUNTER — Ambulatory Visit (INDEPENDENT_AMBULATORY_CARE_PROVIDER_SITE_OTHER): Payer: BC Managed Care – PPO | Admitting: Women's Health

## 2014-04-01 ENCOUNTER — Ambulatory Visit (INDEPENDENT_AMBULATORY_CARE_PROVIDER_SITE_OTHER): Payer: BC Managed Care – PPO

## 2014-04-01 ENCOUNTER — Encounter: Payer: Self-pay | Admitting: Women's Health

## 2014-04-01 DIAGNOSIS — N938 Other specified abnormal uterine and vaginal bleeding: Secondary | ICD-10-CM

## 2014-04-01 DIAGNOSIS — N949 Unspecified condition associated with female genital organs and menstrual cycle: Secondary | ICD-10-CM

## 2014-04-01 DIAGNOSIS — D259 Leiomyoma of uterus, unspecified: Secondary | ICD-10-CM

## 2014-04-01 DIAGNOSIS — N925 Other specified irregular menstruation: Secondary | ICD-10-CM

## 2014-04-01 MED ORDER — MEGESTROL ACETATE 40 MG PO TABS: 40.0000 mg | ORAL_TABLET | Freq: Two times a day (BID) | ORAL | Status: DC

## 2014-04-01 NOTE — Progress Notes (Signed)
Patient ID: Kathy Castro, female   DOB: [birth year], 53 y.o.   MRN: [id number] Presents for ultrasound. At annual exam with having irregular cycles last cycle greater than 6 months prior to cycle in March that lasted 3 weeks, was prescribed Megace but did not take, cycle ended. Vasectomy. Normal TSH, FSH 11. Occasional menopausal symptoms. LMP 03/10/14  Ultrasound: Retroverted uterus, 3 fibroids noted 22 x 29mm, 18 x 19mm, 12 x 6 mm,  ovaries appear normal. No free fluid noted. Endometrium 3.5 mm.  Perimenopausal Small fibroids  Plan: Keep menstrual record, call if cycles/greater than 7 days or space greater than 3 months.

## 2014-04-18 ENCOUNTER — Telehealth: Payer: Self-pay | Admitting: *Deleted

## 2014-04-18 NOTE — Telephone Encounter (Signed)
Pt left message stating cycle started yesterday. Took all megace, I left message for pt to call.

## 2014-04-18 NOTE — Telephone Encounter (Signed)
Patient called. She said when she was in [place] told her to take the Megace because she was still bleeding. She did take it for another 4-5 days after that and finally stopped bleeding. That was 6/18.  She did not bleed again until yesterday 6/28 when light bleeding and cramping started again. Still with light flow and cramps today.  Wanted to know what [place] would recommend she do. Take Megace again?  (She knows [place] off today and response may be tomorrow.)

## 2014-04-19 NOTE — Telephone Encounter (Signed)
Watch for now, may just be a cycle, if bleeding lasts greater than 7 days, instruct to call. If bleeding greater than 1 pad/ hour have her call also.

## 2014-04-19 NOTE — Telephone Encounter (Signed)
Pt informed with the below note. 

## 2014-04-26 ENCOUNTER — Telehealth: Payer: Self-pay

## 2014-04-26 ENCOUNTER — Other Ambulatory Visit: Payer: Self-pay | Admitting: Women's Health

## 2014-04-26 DIAGNOSIS — N938 Other specified abnormal uterine and vaginal bleeding: Secondary | ICD-10-CM

## 2014-04-26 MED ORDER — MEGESTROL ACETATE 40 MG PO TABS: 40.0000 mg | ORAL_TABLET | Freq: Two times a day (BID) | ORAL | Status: DC

## 2014-04-26 NOTE — Telephone Encounter (Signed)
Patient said you told her to report to you if period lasted greater than 7 days.  Today is Day 9.  It has been really light bleeding but the last two days have been the heavier of them but still only changing pad 2-3 x a day.  She wondered what you recommend at this point?

## 2014-04-26 NOTE — Telephone Encounter (Signed)
Patient advised. She did not have enough Megace to do bid x10 days. Rx sent.

## 2014-04-26 NOTE — Telephone Encounter (Signed)
Megace 40 twice daily for 10 days, have her call if bleeding does not stop.  I think she has RX and may not have taken, had [place] with thin endometrium

## 2014-05-10 ENCOUNTER — Telehealth: Payer: Self-pay

## 2014-05-10 MED ORDER — MEGESTROL ACETATE 40 MG PO TABS: 40.0000 mg | ORAL_TABLET | Freq: Two times a day (BID) | ORAL | Status: DC

## 2014-05-10 NOTE — Telephone Encounter (Signed)
Patient informed. Megace refill ordered.  Appt with TF scheduled for tomorrow.

## 2014-05-10 NOTE — Telephone Encounter (Signed)
I think we need office visit with Dr Phineas Real to discuss treatment, megace not stopping bleeding.  Fibroids probably causing bleeding,  endometrial lining thin, refill the megace in the meantime.

## 2014-05-10 NOTE — Telephone Encounter (Signed)
Patient said she took Megace x 10 days to help stop her bleeding.  It has not stopped while taking it. Yesterday was first day without Megace and bleeding got heavier.

## 2014-05-11 ENCOUNTER — Encounter: Payer: Self-pay | Admitting: Women's Health

## 2014-05-11 ENCOUNTER — Ambulatory Visit (INDEPENDENT_AMBULATORY_CARE_PROVIDER_SITE_OTHER): Payer: BC Managed Care – PPO | Admitting: Gynecology

## 2014-05-11 ENCOUNTER — Encounter: Payer: Self-pay | Admitting: Gynecology

## 2014-05-11 ENCOUNTER — Telehealth: Payer: Self-pay

## 2014-05-11 DIAGNOSIS — N926 Irregular menstruation, unspecified: Secondary | ICD-10-CM

## 2014-05-11 NOTE — Telephone Encounter (Signed)
Patient had blood drawn at work yesterday and they were to fax results to you. She wanted to check have you seen it?

## 2014-05-11 NOTE — Patient Instructions (Signed)
Follow up for ultrasound as scheduled 

## 2014-05-11 NOTE — Telephone Encounter (Signed)
Left message on CBC normal in spite of bleeding.

## 2014-05-11 NOTE — Telephone Encounter (Signed)
Called, reviewed CBC normal.

## 2014-05-11 NOTE — Progress Notes (Signed)
Kathy Castro [birth year] [id number]        53 y.o.  [id number] presents with a history of no menses for approximately 8 months and then started bleeding in March. Saw Nancy with normal FSH TSH prolactin. Treated with intermittent Megace but has continued to bleed on and off since then. Ultrasound 03/2014 showed several small myomas with an endometrial echo of 3.5 mm. Had been having some hot flashes but these have resolved.  Past medical history,surgical history, problem list, medications, allergies, family history and social history were all reviewed and documented in the EPIC chart.  Directed ROS with pertinent positives and negatives documented in the history of present illness/assessment and plan.  Exam: Kathy Castro assistant General appearance:  Normal Abdomen soft nontender without masses guarding or rebound Pelvic external BUS vagina normal. Cervix normal. Uterus retroverted generous in size midline mobile nontender. Adnexa without masses or tenderness  Assessment/Plan:  53 y.o. [id number] with perimenopausal irregular bleeding despite Megace treatment and multiple small myomas. Normal hormone studies. Recommend sonohysterogram to assess endometrial cavity for submucous myomas/polyps and to obtain an endometrial sample. Various scenarios and possibilities reviewed with her. Will readdress after sonohysterogram and biopsy results.   Note: This document was prepared with digital dictation and possible smart phrase technology. Any transcriptional errors that result from this process are unintentional.   Anastasio Auerbach MD, 8:29 AM 05/11/2014

## 2014-05-26 ENCOUNTER — Telehealth: Payer: Self-pay | Admitting: *Deleted

## 2014-05-26 ENCOUNTER — Other Ambulatory Visit: Payer: Self-pay | Admitting: Women's Health

## 2014-05-26 MED ORDER — MEGESTROL ACETATE 40 MG PO TABS: 40.0000 mg | ORAL_TABLET | Freq: Two times a day (BID) | ORAL | Status: DC

## 2014-05-26 NOTE — Telephone Encounter (Signed)
Pt will be leaving out of town tomorrow and asked if she could have a refill on megace.(DUB) Not bleeding now, but would like to have with her incase bleeding should occur. Please advise

## 2014-05-26 NOTE — Telephone Encounter (Signed)
Left message Rx called into pharmacy.

## 2014-05-27 ENCOUNTER — Ambulatory Visit: Payer: BC Managed Care – PPO | Admitting: Gynecology

## 2014-05-27 ENCOUNTER — Other Ambulatory Visit: Payer: BC Managed Care – PPO

## 2014-05-27 NOTE — Telephone Encounter (Signed)
Telephone call, states having heavy bleeding again, passing large clots, instructed to take Megace twice daily until sonohysterogram appointment with Dr. Phineas Real.

## 2014-06-06 ENCOUNTER — Other Ambulatory Visit: Payer: Self-pay | Admitting: Women's Health

## 2014-06-09 ENCOUNTER — Other Ambulatory Visit: Payer: Self-pay | Admitting: Gynecology

## 2014-06-09 DIAGNOSIS — N926 Irregular menstruation, unspecified: Secondary | ICD-10-CM

## 2014-06-09 DIAGNOSIS — D259 Leiomyoma of uterus, unspecified: Secondary | ICD-10-CM

## 2014-06-09 DIAGNOSIS — N921 Excessive and frequent menstruation with irregular cycle: Secondary | ICD-10-CM

## 2014-06-21 ENCOUNTER — Telehealth: Payer: Self-pay

## 2014-06-21 NOTE — Telephone Encounter (Signed)
Patient is scheduled for [facility] on Thursday but has been bleeding for months without stopping. She said even the Megace has not stopped it. She really would like to be able to have procedure Thursday and asked if she should maybe "double up on her Megace" between now and the.  I do see back on 8/7 where [place] told her to take two daily until [facility].  I will check with her about what she is taking and recommend she double up til then. What if she is still bleeding?

## 2014-06-21 NOTE — Telephone Encounter (Signed)
Will be okay to do procedure whether she's bleeding or not. So keep the sonohysterogram appointment. 40 mg of Megace is a pretty good dose. I would not recommend taking twice as much.

## 2014-06-21 NOTE — Telephone Encounter (Signed)
Patient informed. 

## 2014-06-22 ENCOUNTER — Other Ambulatory Visit: Payer: Self-pay | Admitting: Gynecology

## 2014-06-22 ENCOUNTER — Ambulatory Visit (INDEPENDENT_AMBULATORY_CARE_PROVIDER_SITE_OTHER): Payer: BC Managed Care – PPO

## 2014-06-22 ENCOUNTER — Ambulatory Visit (INDEPENDENT_AMBULATORY_CARE_PROVIDER_SITE_OTHER): Payer: BC Managed Care – PPO | Admitting: Gynecology

## 2014-06-22 ENCOUNTER — Encounter: Payer: Self-pay | Admitting: Gynecology

## 2014-06-22 DIAGNOSIS — N926 Irregular menstruation, unspecified: Secondary | ICD-10-CM

## 2014-06-22 DIAGNOSIS — D259 Leiomyoma of uterus, unspecified: Secondary | ICD-10-CM

## 2014-06-22 DIAGNOSIS — N921 Excessive and frequent menstruation with irregular cycle: Secondary | ICD-10-CM

## 2014-06-22 DIAGNOSIS — R9389 Abnormal findings on diagnostic imaging of other specified body structures: Secondary | ICD-10-CM

## 2014-06-22 DIAGNOSIS — N920 Excessive and frequent menstruation with regular cycle: Secondary | ICD-10-CM

## 2014-06-22 DIAGNOSIS — D251 Intramural leiomyoma of uterus: Secondary | ICD-10-CM

## 2014-06-22 DIAGNOSIS — N840 Polyp of corpus uteri: Secondary | ICD-10-CM

## 2014-06-22 NOTE — Progress Notes (Signed)
Kathy Castro March 27, 1961 [id number]        53 y.o.  [id number] presents for sonohysterogram due to prolonged irregular bleeding. History of no menses for 8 months and then began bleeding in June and has bled on and off since then. Has been on Megace 40 mg daily but still continues to bleed. Recent blood work to include [place] prolactin and TSH were all normal.  Past medical history,surgical history, problem list, medications, allergies, family history and social history were all reviewed and documented in the EPIC chart.  Directed ROS with pertinent positives and negatives documented in the history of present illness/assessment and plan.  Exam: [place] assistant General appearance:  Normal HEENT normal Lungs clear Cardiac regular rate without rubs murmurs or gallops Abdomen soft nontender without masses guarding rebound Pelvic external BUS vagina normal. Cervix grossly normal. Uterus retroverted generous in size. Adnexa without masses or tenderness.  Ultrasound shows uterus generous in size with multiple small myomas. 4 measured the largest 30 mm. Endometrial echo 9.9 mm. Right and left ovaries normal. Cul-de-sac negative.  Sonohysterogram performed, sterile technique, easy catheter introduction, good distention with 3 apparent polyps measuring 15 mm, 10 mm and 7 mm. Endometrial sample taken. Catheter was noted to be in the lower uterine segment unable to negotiate the retroversion into the fundus.   Assessment/Plan:  53 y.o. [id number] with irregular bleeding and normal FSH with ultrasound showing multiple small myomas and 3 endometrial polyps.  Options for management reviewed and ultimately recommended hysteroscopy, D&C resection of endometrial polyps. With the leiomyoma I did review I cannot guarantee that this will resolve her bleeding but certainly given her history probably will. She also asked me about endometrial ablation at this point I think again her issue is prolonged irregular bleeding and  hopefully removing the polyps will resolve this. I reviewed the expected intraoperative and postoperative courses as well as the recovery period. Risks to include infection, prolonged antibiotics, hemorrhage necessitating transfusion and the risks of transfusion including transfusion reaction, hepatitis, HIV, mad cow disease another unknown entities were discussed. The risk of uterine perforation and internal organ damage, bladder, ureters, vessels, nerves either immediately recognized readily recognized necessitating major exploratory reparative surgeries and future repair of surgeries including ostomy formation, bowel resection, bladder/ureter reparative surgeries was also discussed. Distended media absorption leading to metabolic complication such as fluid overload, coma and seizures was also discussed. The patient agrees with scheduling the surgery and will followup for this. I did perform a complete exam today in anticipation of her surgery.   Note: This document was prepared with digital dictation and possible smart phrase technology. Any transcriptional errors that result from this process are unintentional.   Anastasio Auerbach MD, 4:57 PM 06/22/2014

## 2014-06-22 NOTE — Patient Instructions (Signed)
Office will call you to arrange surgery. 

## 2014-06-23 ENCOUNTER — Other Ambulatory Visit: Payer: Self-pay | Admitting: Gynecology

## 2014-06-23 ENCOUNTER — Telehealth: Payer: Self-pay

## 2014-06-23 ENCOUNTER — Ambulatory Visit: Payer: BC Managed Care – PPO | Admitting: Gynecology

## 2014-06-23 ENCOUNTER — Other Ambulatory Visit: Payer: BC Managed Care – PPO

## 2014-06-23 MED ORDER — MISOPROSTOL 200 MCG PO TABS: ORAL_TABLET | ORAL | Status: DC

## 2014-06-23 NOTE — Telephone Encounter (Signed)
Okay to stay off the Megace

## 2014-06-23 NOTE — Telephone Encounter (Signed)
Patient called me back to schedule surgery. We discussed dates earlier and she wants 07/28/14 1:00pm.  I scheduled that for her. Dr. Loetta Rough left it up to her if she wanted to return for pre op consult and at this time she declines but knows she is welcome to if she decides she wants to.  We discussed the need for Cytotec tab vaginally hs before surgery and I sent that to her pharmacy.  Dr. Loetta Rough- Patient did not take Megace yesterday or today and has had no bleeding since the [facility] yesterday.  She asked if okay to stay off the Megace and see how things go without it?

## 2014-06-24 ENCOUNTER — Telehealth: Payer: Self-pay

## 2014-06-24 NOTE — Telephone Encounter (Signed)
Message copied by Ramond Craver on Fri Jun 24, 2014  3:07 PM ------      Message from: Kathy Castro      Created: Fri Jun 24, 2014  2:53 PM       Tell patient biopsy from the ultrasound was benign. ------

## 2014-06-24 NOTE — Telephone Encounter (Signed)
Message copied by Ramond Craver on Fri Jun 24, 2014  3:10 PM ------      Message from: Kathy Castro      Created: Fri Jun 24, 2014  2:53 PM       Tell patient biopsy from the ultrasound was benign. ------

## 2014-06-24 NOTE — Telephone Encounter (Signed)
Patient had asked me a question yesterday when scheduling surgery about ablation versus hysteroscopy.  I had explained to her that you would need to remove the endometrial polyps before you performed ablation.  I asked her if she wanted me to ask you if she was a candidate for ablation at time of D&C, Hyst. And she declined.  Today she has question.  She asked if you would be removing her myomas when you took out the polyps?  She asked would ablation get rid of the myomas?

## 2014-06-24 NOTE — Telephone Encounter (Signed)
Patient informed. She said that she did recall you telling her this and this helped her to understand.

## 2014-06-24 NOTE — Telephone Encounter (Signed)
Left detailed message on cell phone voice mail.

## 2014-06-24 NOTE — Telephone Encounter (Signed)
We did discuss this at her office visit. We can do ablation at the time of polyp removal but the problem is we do not have the pathology from the polyps and if they would be atypical that puts [place] in a dilemma because we would not be able to further evaluate the cavity due to the ablation. The preferred method is to take the polyps out first wait to see if bleeding continues to be an issue and then decide what needs to be done. Good chance by taking the polyps out that her bleeding will get better. We will not be taking the fibroids out because they are in the wall of her uterus and that is major surgery to remove them.

## 2014-07-04 ENCOUNTER — Other Ambulatory Visit: Payer: Self-pay | Admitting: Women's Health

## 2014-07-05 ENCOUNTER — Telehealth: Payer: Self-pay | Admitting: *Deleted

## 2014-07-05 NOTE — Telephone Encounter (Signed)
Message copied by Thamas Jaegers on Tue Jul 05, 2014  8:56 AM ------      Message from: Kathy Castro      Created: Tue Jul 05, 2014  7:52 AM       Received refill request for Megace from pharmacy. Need to know what's going on with her bleeding. ------

## 2014-07-05 NOTE — Telephone Encounter (Signed)
Unable to leave message on pt voicemail, pt mailbox is not set up. Will try to call pt again later

## 2014-07-06 ENCOUNTER — Encounter (HOSPITAL_COMMUNITY): Payer: Self-pay

## 2014-07-18 ENCOUNTER — Encounter (HOSPITAL_COMMUNITY): Payer: Self-pay | Admitting: Pharmacist

## 2014-07-21 NOTE — H&P (Signed)
  Kathy Castro 1960/12/22 [id number]   History and Physical  Chief complaint: irregular bleeding, endometrial polyps  History of present illness: 53 y.o. [id number] with a history of prolonged irregular bleeding. She had no menses for 8 months and then began bleeding in June and has bled on and off since then. Has been on Megace 40 mg daily but still continues to bleed. Recent blood work to include [place] prolactin and TSH were all normal.  Ultrasound shows uterus generous in size with multiple small myomas. 4 measured the largest 30 mm. Endometrial echo 9.9 mm. Right and left ovaries normal. Cul-de-sac negative.  Sonohysterogram performed showed 3 apparent polyps measuring 15 mm, 10 mm and 7 mm. Endometrial sample showed benign endometrium. Patient is admitted for hysteroscopy D&C resection of the endometrial polyps.   Past medical history,surgical history, medications, allergies, family history and social history were all reviewed and documented in the EPIC chart.  ROS:  Was performed and pertinent positives and negatives are included in the history of present illness.  Exam:  [place] assistant General: well developed, well nourished female, no acute distress HEENT: normal  Lungs: clear to auscultation without wheezing, rales or rhonchi  Cardiac: regular rate without rubs, murmurs or gallops  Abdomen: soft, nontender without masses, guarding, rebound, organomegaly  Pelvic: external bus vagina: normal   Cervix: grossly normal  Uterus: retroverted, generous in size  Adnexa: without masses or tenderness     Assessment/Plan:  53 y.o. [id number] with irregular bleeding and normal FSH with ultrasound showing multiple small myomas and 3 endometrial polyps. Options for management reviewed and ultimately recommended hysteroscopy, D&C resection of endometrial polyps. With the leiomyoma I did review I cannot guarantee that this will resolve her bleeding but certainly given her history probably will. She  also asked me about endometrial ablation at this point I think again her issue is prolonged irregular bleeding and hopefully removing the polyps will resolve this. I reviewed the expected intraoperative and postoperative courses as well as the recovery period. Risks to include infection, prolonged antibiotics, hemorrhage necessitating transfusion and the risks of transfusion including transfusion reaction, hepatitis, HIV, mad cow disease another unknown entities were discussed. The risk of uterine perforation and internal organ damage including bowel, bladder, ureters, vessels, nerves either immediately recognized or delayed recognized necessitating major exploratory reparative surgeries and future repair of surgeries including ostomy formation, bowel resection, bladder/ureter reparative surgeries was also discussed. Distended media absorption leading to metabolic complication such as fluid overload, coma and seizures was also discussed. The patient's questions were answered she is ready to proceed with surgery.   Anastasio Auerbach MD, 4:17 PM 07/21/2014

## 2014-07-27 MED ORDER — DEXTROSE 5 % IV SOLN
2.0000 g | INTRAVENOUS | Status: AC
  Administered 2014-07-28: 2 g via INTRAVENOUS
  Filled 2014-07-27: qty 2

## 2014-07-28 ENCOUNTER — Ambulatory Visit (HOSPITAL_COMMUNITY): Payer: BC Managed Care – PPO | Admitting: Certified Registered Nurse Anesthetist

## 2014-07-28 ENCOUNTER — Ambulatory Visit (HOSPITAL_COMMUNITY)
Admission: RE | Admit: 2014-07-28 | Discharge: 2014-07-28 | Disposition: A | Discharge status: Home / Self Care | Payer: BC Managed Care – PPO | Source: Ambulatory Visit | Attending: Gynecology | Admitting: Gynecology

## 2014-07-28 ENCOUNTER — Encounter (HOSPITAL_COMMUNITY)
Admission: RE | Disposition: A | Discharge status: Home / Self Care | Payer: Self-pay | Source: Ambulatory Visit | Attending: Gynecology

## 2014-07-28 ENCOUNTER — Encounter (HOSPITAL_COMMUNITY): Payer: BC Managed Care – PPO | Admitting: Certified Registered Nurse Anesthetist

## 2014-07-28 ENCOUNTER — Encounter (HOSPITAL_COMMUNITY): Payer: Self-pay | Admitting: Certified Registered Nurse Anesthetist

## 2014-07-28 DIAGNOSIS — N840 Polyp of corpus uteri: Secondary | ICD-10-CM | POA: Insufficient documentation

## 2014-07-28 DIAGNOSIS — N926 Irregular menstruation, unspecified: Secondary | ICD-10-CM | POA: Diagnosis present

## 2014-07-28 HISTORY — PX: DILATATION & CURETTAGE/HYSTEROSCOPY WITH MYOSURE: SHX6511

## 2014-07-28 LAB — CBC
HEMATOCRIT: 40.9 % (ref 36.0–46.0)
HEMOGLOBIN: 13.2 g/dL (ref 12.0–15.0)
MCH: 27.7 pg (ref 26.0–34.0)
MCHC: 32.3 g/dL (ref 30.0–36.0)
MCV: 85.9 fL (ref 78.0–100.0)
Platelets: 316 10*3/uL (ref 150–400)
RBC: 4.76 MIL/uL (ref 3.87–5.11)
RDW: 14.8 % (ref 11.5–15.5)
WBC: 8.8 10*3/uL (ref 4.0–10.5)

## 2014-07-28 SURGERY — DILATATION & CURETTAGE/HYSTEROSCOPY WITH MYOSURE
Anesthesia: General | Site: Vagina

## 2014-07-28 MED ORDER — PROPOFOL 10 MG/ML IV BOLUS
INTRAVENOUS | Status: DC | PRN
  Administered 2014-07-28: 200 mg via INTRAVENOUS

## 2014-07-28 MED ORDER — SODIUM CHLORIDE 0.9 % IR SOLN
Status: DC | PRN
  Administered 2014-07-28: 3000 mL

## 2014-07-28 MED ORDER — ONDANSETRON HCL 4 MG/2ML IJ SOLN
INTRAMUSCULAR | Status: DC | PRN
  Administered 2014-07-28: 4 mg via INTRAVENOUS

## 2014-07-28 MED ORDER — LACTATED RINGERS IV SOLN
INTRAVENOUS | Status: DC
  Administered 2014-07-28 (×2): via INTRAVENOUS

## 2014-07-28 MED ORDER — DEXAMETHASONE SODIUM PHOSPHATE 10 MG/ML IJ SOLN
INTRAMUSCULAR | Status: DC | PRN
  Administered 2014-07-28: 4 mg via INTRAVENOUS

## 2014-07-28 MED ORDER — LIDOCAINE HCL 1 % IJ SOLN
INTRAMUSCULAR | Status: AC
  Filled 2014-07-28: qty 40

## 2014-07-28 MED ORDER — FENTANYL CITRATE 0.05 MG/ML IJ SOLN
25.0000 ug | INTRAMUSCULAR | Status: DC | PRN
  Administered 2014-07-28: 50 ug via INTRAVENOUS

## 2014-07-28 MED ORDER — KETOROLAC TROMETHAMINE 30 MG/ML IJ SOLN: 15.0000 mg | Freq: Once | INTRAMUSCULAR | Status: DC | PRN

## 2014-07-28 MED ORDER — OXYCODONE-ACETAMINOPHEN 5-325 MG PO TABS: 1.0000 | ORAL_TABLET | ORAL | Status: DC | PRN

## 2014-07-28 MED ORDER — MIDAZOLAM HCL 2 MG/2ML IJ SOLN: 0.5000 mg | Freq: Once | INTRAMUSCULAR | Status: DC | PRN

## 2014-07-28 MED ORDER — SCOPOLAMINE 1 MG/3DAYS TD PT72
1.0000 | MEDICATED_PATCH | Freq: Once | TRANSDERMAL | Status: DC
  Administered 2014-07-28: 1.5 mg via TRANSDERMAL

## 2014-07-28 MED ORDER — LIDOCAINE HCL (CARDIAC) 20 MG/ML IV SOLN
INTRAVENOUS | Status: DC | PRN
  Administered 2014-07-28: 50 mg via INTRAVENOUS

## 2014-07-28 MED ORDER — FENTANYL CITRATE 0.05 MG/ML IJ SOLN
INTRAMUSCULAR | Status: AC
  Filled 2014-07-28: qty 2

## 2014-07-28 MED ORDER — MIDAZOLAM HCL 2 MG/2ML IJ SOLN
INTRAMUSCULAR | Status: DC | PRN
  Administered 2014-07-28: 2 mg via INTRAVENOUS

## 2014-07-28 MED ORDER — KETOROLAC TROMETHAMINE 30 MG/ML IJ SOLN
INTRAMUSCULAR | Status: DC | PRN
Start: 2014-07-28 — End: 2014-07-28
  Administered 2014-07-28: 30 mg via INTRAVENOUS

## 2014-07-28 MED ORDER — KETOROLAC TROMETHAMINE 30 MG/ML IJ SOLN
INTRAMUSCULAR | Status: AC
  Filled 2014-07-28: qty 1

## 2014-07-28 MED ORDER — SCOPOLAMINE 1 MG/3DAYS TD PT72
MEDICATED_PATCH | TRANSDERMAL | Status: AC
  Administered 2014-07-28: 1.5 mg via TRANSDERMAL
  Filled 2014-07-28: qty 1

## 2014-07-28 MED ORDER — PROMETHAZINE HCL 25 MG/ML IJ SOLN: 6.2500 mg | INTRAMUSCULAR | Status: DC | PRN

## 2014-07-28 MED ORDER — PROPOFOL 10 MG/ML IV EMUL
INTRAVENOUS | Status: AC
  Filled 2014-07-28: qty 20

## 2014-07-28 MED ORDER — DEXAMETHASONE SODIUM PHOSPHATE 4 MG/ML IJ SOLN
INTRAMUSCULAR | Status: AC
  Filled 2014-07-28: qty 1

## 2014-07-28 MED ORDER — ONDANSETRON HCL 4 MG/2ML IJ SOLN
INTRAMUSCULAR | Status: AC
  Filled 2014-07-28: qty 2

## 2014-07-28 MED ORDER — LIDOCAINE HCL 1 % IJ SOLN
INTRAMUSCULAR | Status: DC | PRN
  Administered 2014-07-28: 10 mL

## 2014-07-28 MED ORDER — MIDAZOLAM HCL 2 MG/2ML IJ SOLN
INTRAMUSCULAR | Status: AC
  Filled 2014-07-28: qty 2

## 2014-07-28 MED ORDER — MEPERIDINE HCL 25 MG/ML IJ SOLN: 6.2500 mg | INTRAMUSCULAR | Status: DC | PRN

## 2014-07-28 MED ORDER — FENTANYL CITRATE 0.05 MG/ML IJ SOLN
INTRAMUSCULAR | Status: DC | PRN
  Administered 2014-07-28 (×2): 50 ug via INTRAVENOUS

## 2014-07-28 SURGICAL SUPPLY — 16 items
CATH ROBINSON RED A/P 16FR (CATHETERS) ×2 IMPLANT
CLOTH BEACON ORANGE TIMEOUT ST (SAFETY) ×2 IMPLANT
CONTAINER PREFILL 10% NBF 60ML (FORM) ×4 IMPLANT
DEVICE MYOSURE CLASSIC (MISCELLANEOUS) IMPLANT
DEVICE MYOSURE LITE (MISCELLANEOUS) IMPLANT
DRAPE HYSTEROSCOPY (DRAPE) ×2 IMPLANT
FILTER ARTHROSCOPY CONVERTOR (FILTER) ×2 IMPLANT
GLOVE BIO SURGEON STRL SZ7 (GLOVE) ×4 IMPLANT
GOWN STRL REUS W/TWL LRG LVL3 (GOWN DISPOSABLE) ×4 IMPLANT
PACK VAGINAL MINOR WOMEN LF (CUSTOM PROCEDURE TRAY) ×2 IMPLANT
PAD OB MATERNITY 4.3X12.25 (PERSONAL CARE ITEMS) ×2 IMPLANT
SEAL ROD LENS SCOPE MYOSURE (ABLATOR) ×2 IMPLANT
SET TUBING HYSTEROSCOPY 2 NDL (TUBING) ×2 IMPLANT
TOWEL OR 17X24 6PK STRL BLUE (TOWEL DISPOSABLE) ×4 IMPLANT
TUBE HYSTEROSCOPY W Y-CONNECT (TUBING) ×2 IMPLANT
WATER STERILE IRR 1000ML POUR (IV SOLUTION) ×2 IMPLANT

## 2014-07-28 NOTE — Anesthesia Preprocedure Evaluation (Signed)
Anesthesia Evaluation  Patient identified by MRN, date of birth, ID band Patient awake    Reviewed: Allergy & Precautions, H&P , Patient's Chart, lab work & pertinent test results, reviewed documented beta blocker date and time   History of Anesthesia Complications Negative for: history of anesthetic complications  Airway Mallampati: II TM Distance: >3 FB Neck ROM: full    Dental   Pulmonary former smoker,  breath sounds clear to auscultation        Cardiovascular Exercise Tolerance: Good Rhythm:regular Rate:Normal     Neuro/Psych    GI/Hepatic   Endo/Other    Renal/GU      Musculoskeletal   Abdominal   Peds  Hematology   Anesthesia Other Findings   Reproductive/Obstetrics                           Anesthesia Physical Anesthesia Plan  ASA: II  Anesthesia Plan: General LMA   Post-op Pain Management:    Induction:   Airway Management Planned:   Additional Equipment:   Intra-op Plan:   Post-operative Plan:   Informed Consent: I have reviewed the patients History and Physical, chart, labs and discussed the procedure including the risks, benefits and alternatives for the proposed anesthesia with the patient or authorized representative who has indicated his/her understanding and acceptance.   Dental Advisory Given  Plan Discussed with: CRNA and Surgeon  Anesthesia Plan Comments:         Anesthesia Quick Evaluation

## 2014-07-28 NOTE — Op Note (Signed)
Kathy Castro April 05, 1961 [id number]   Post Operative Note   Date of surgery:  07/28/2014  Pre Op Dx:  Irregular bleeding, endometrial polyps  Post Op Dx:  Endometrial bleeding, endometrial polyps  Procedure:  Hysteroscopy, D&C Myosure resection of endometrial polyps  Surgeon:  Donalynn Furlong P  Anesthesia:  General  EBL:  minimal  Distended media discrepancy:  563 cc saline  Complications:  None  Specimen:  #1 endometrial curettings #2 endometrial polyps to pathology  Findings: EUA:  External BUS vagina normal. Cervix normal. Uterus retroverted grossly normal size midline mobile. Adnexa without masses   Hysteroscopy:  Adequate noting fundus, anterior/posterior endometrial surfaces, lower uterine segment, endocervical canal, right/left tubal ostial visualize. 3 fingerlike polyps from mid to upper posterior endometrial surface. All resected completely to the level of the surrounding endometrium  Procedure:  The patient was taken to the operating room, underwent general anesthesia, was placed in the low dorsal lithotomy position, received a perineal/vaginal preparation with Betadine solution and the bladder was emptied with an in and out Foley catheterization per nursing personnel. The time out was performed by the surgical team.  The patient was draped in the usual fashion and the cervix was visualized with a speculum, the anterior lip grasped with a single-tooth tenaculum and a paracervical block was placed using 1% lidocaine, 10 cc total. The cervix was gently dilated to admit the small Myosure hysteroscope and hysteroscopy was performed with findings noted above. Using the small Myosure resectoscopic wand, the polyps were resected without difficulty to the level of the surrounding endometrium. A sharp curettage was then performed and the specimens were sent separately to pathology. Repeat hysteroscopy showed an empty cavity with good distention, adequate hemostasis and no evidence of  perforation. The instruments were removed as was the tenaculum and hemostasis was visualized at the tenaculum sites and external os. The patient received intraoperative Toradol, was awakened without difficulty and was taken to the recovery room in good condition having tolerated the procedure well.     Anastasio Auerbach MD, 2:04 PM 07/28/2014

## 2014-07-28 NOTE — Discharge Instructions (Signed)
° °  Postoperative Instructions Hysteroscopy D & C ° °Dr. Keiri Solano and the nursing staff have discussed postoperative instructions with you.  If you have any questions please ask them before you leave the hospital, or call Dr Laykin Rainone’s office at [phone].   ° °We would like to emphasize the following instructions: ° ° °? Call the office to make your follow-up appointment as recommended by Dr Yuta Cipollone (usually 1-2 weeks). ° °? You were given a prescription, or one was ordered for you at the pharmacy you designated.  Get that prescription filled and take the medication according to instructions. ° °? You may eat a regular diet, but slowly until you start having bowel movements. ° °? Drink plenty of water daily. ° °? Nothing in the vagina (intercourse, douching, objects of any kind) for two weeks.  When reinitiating intercourse, if it is uncomfortable, stop and make an appointment with Dr Avital Dancy to be evaluated. ° °? No driving for one to two days until the effects of anesthesia has worn off.  No traveling out of town for several days. ° °? You may shower, but no baths for one week.  Walking up and down stairs is ok.  No heavy lifting, prolonged standing, repeated bending or any “working out” until your post op check. ° °? Rest frequently, listen to your body and do not push yourself and overdo it. ° °? Call if: ° °o Your pain medication does not seem strong enough. °o Worsening pain or abdominal bloating °o Persistent nausea or vomiting °o Difficulty with urination or bowel movements. °o Temperature of 101 degrees or higher. °o Heavy vaginal bleeding.  If your period is due, you may use tampons. °o You have any questions or concerns ° ° ° °

## 2014-07-28 NOTE — Transfer of Care (Signed)
Immediate Anesthesia Transfer of Care Note  Patient: Kathy Castro  Procedure(s) Performed: Procedure(s) with comments: DILATATION & CURETTAGE/HYSTEROSCOPY WITH MYOSURE ABLATION (N/A) - Arville Lime, Myosure rep to be present. Confirmed with him.  Patient Location: PACU  Anesthesia Type:General  Level of Consciousness: awake, alert  and oriented  Airway & Oxygen Therapy: Patient Spontanous Breathing and Patient connected to nasal cannula oxygen  Post-op Assessment: Report given to PACU RN and Post -op Vital signs reviewed and stable  Post vital signs: Reviewed and stable  Complications: No apparent anesthesia complications

## 2014-07-28 NOTE — Anesthesia Postprocedure Evaluation (Signed)
  Anesthesia Post Note  Patient: Kathy Castro  Procedure(s) Performed: Procedure(s) (LRB): DILATATION & CURETTAGE/HYSTEROSCOPY WITH MYOSURE ABLATION (N/A)  Anesthesia type: GA  Patient location: PACU  Post pain: Pain level controlled  Post assessment: Post-op Vital signs reviewed  Last Vitals:  Filed Vitals:   07/28/14 1345  BP: 117/78  Pulse: 73  Temp:   Resp: 16    Post vital signs: Reviewed  Level of consciousness: sedated  Complications: No apparent anesthesia complications

## 2014-07-28 NOTE — H&P (Signed)
  The patient was examined.  I reviewed the proposed surgery and consent form with the patient.  The dictated history and physical is current and accurate and all questions were answered. The patient is ready to proceed with surgery and has a realistic understanding and expectation for the outcome.   Anastasio Auerbach MD, 12:35 PM 07/28/2014

## 2014-07-28 NOTE — Anesthesia Procedure Notes (Addendum)
Date/Time: 07/28/2014 1:06 PM Performed by: Clifton Custard R   Procedure Name: LMA Insertion Date/Time: 07/28/2014 12:52 PM Performed by: Bufford Spikes Pre-anesthesia Checklist: Patient identified, Patient being monitored, Emergency Drugs available, Timeout performed and Suction available Patient Re-evaluated:Patient Re-evaluated prior to inductionOxygen Delivery Method: Circle system utilized Preoxygenation: Pre-oxygenation with 100% oxygen Intubation Type: IV induction Ventilation: Mask ventilation without difficulty LMA: LMA inserted LMA Size: 4.0 Number of attempts: 1 Dental Injury: Teeth and Oropharynx as per pre-operative assessment

## 2014-08-01 ENCOUNTER — Telehealth: Payer: Self-pay | Admitting: *Deleted

## 2014-08-01 NOTE — Telephone Encounter (Signed)
Left the below on pt voicemail. 

## 2014-08-01 NOTE — Telephone Encounter (Signed)
(  Dr.Fontaine patient) pt has D & C on 07/28/14 asked if normal to still having spotting? Pt is not having any problems, had bleeding for 2 days after surgery. No bleeding last night, spotting this am. Please advise

## 2014-08-01 NOTE — Telephone Encounter (Signed)
Please reassure that this is not unusual after recent D&C and should gradually subside over the next few days.

## 2014-08-12 ENCOUNTER — Encounter: Payer: Self-pay | Admitting: Gynecology

## 2014-08-12 ENCOUNTER — Ambulatory Visit (INDEPENDENT_AMBULATORY_CARE_PROVIDER_SITE_OTHER): Payer: BC Managed Care – PPO | Admitting: Gynecology

## 2014-08-12 DIAGNOSIS — Z9889 Other specified postprocedural states: Secondary | ICD-10-CM

## 2014-08-12 MED ORDER — MEDROXYPROGESTERONE ACETATE 10 MG PO TABS: 10.0000 mg | ORAL_TABLET | Freq: Every day | ORAL | Status: DC

## 2014-08-12 NOTE — Progress Notes (Signed)
Kathy Castro 12/15/1960 [id number]        53 y.o.  [id number] Presents for her postoperative visit status post hysteroscopy D&C for endometrial polyps 07/28/2014. Has done well since then other than has had some bleeding on and off. Doses she had light spotting for the first 2 weeks or so post operative and then stopped bleeding for several days and began bleeding heavily likely. 3 days ago. It is now starting to taper off.  Past medical history,surgical history, problem list, medications, allergies, family history and social history were all reviewed and documented in the EPIC chart.  Directed ROS with pertinent positives and negatives documented in the history of present illness/assessment and plan.  Exam: [place] assistant General appearance:  Normal Abdomen soft nontender without masses guarding rebound Pelvic external BUS vagina with slight and sees flow. Cervix normal. Uterus retroverted grossly normal size midline mobile nontender. Adnexa without masses or tenderness  Assessment/Plan:  53 y.o. [id number] status post hysteroscopic resection endometrial polyp. I reviewed the pathology which showed benign endometrial polyps and pictures from the surgery. Patient's going to monitor her bleeding of the next week or so. If resolves then we'll keep menstrual calendar as long she has regular menses we'll follow. If she goes more than 6-8 weeks without a period she knows to call so we can give her a progesterone withdrawal. Her [place] was normal earlier this year. If she continues to bleed over the next week or so I gave her a prescription for Provera 10 mg to take daily for 10 days and see if this does not resolve her bleeding. Otherwise specifics as well and she'll follow up and she is due for her annual exam in May 2016 but will follow up sooner if the irregular bleeding continues.     Anastasio Auerbach MD, 11:42 AM 08/12/2014

## 2014-08-12 NOTE — Patient Instructions (Signed)
Follow up if irregular bleeding continues. Take the progesterone pills if you do not have a period in 6-8 weeks.

## 2014-08-22 ENCOUNTER — Encounter: Payer: Self-pay | Admitting: Gynecology

## 2014-10-04 ENCOUNTER — Telehealth: Payer: Self-pay | Admitting: *Deleted

## 2014-10-04 NOTE — Telephone Encounter (Signed)
Pt called to follow up from [place] 08/12/14 started provera 10 mg daily to start cycle, states she is on day 8 of provera and not cycle yet. Also c/o feeling moody, feeling down thought she would relay this as well. Should patient wait 2 weeks after last pill and call if no cycle? Please advise

## 2014-10-04 NOTE — Telephone Encounter (Signed)
correct 

## 2014-10-04 NOTE — Telephone Encounter (Signed)
Pt informed with the below note. 

## 2014-11-02 ENCOUNTER — Telehealth: Payer: Self-pay | Admitting: *Deleted

## 2014-11-02 NOTE — Telephone Encounter (Signed)
Pt called stated no cycle? I was unable to leave a message for pt because her voicemail was not set up.

## 2014-11-04 NOTE — Telephone Encounter (Signed)
Pt called and said LMP:10/05/14 asked if provera should be given at this time. I explained to pt what TF OV note said 08/12/14 "If she goes more than 6-8 weeks without a period she knows to call so we can give her a progesterone withdrawal. Pt said okay and she will follow up if no bleeding during this time frame.

## 2015-11-01 ENCOUNTER — Ambulatory Visit: Payer: Self-pay | Admitting: Podiatry

## 2016-09-19 ENCOUNTER — Ambulatory Visit (INDEPENDENT_AMBULATORY_CARE_PROVIDER_SITE_OTHER): Payer: BLUE CROSS/BLUE SHIELD | Admitting: Women's Health

## 2016-09-19 ENCOUNTER — Encounter: Payer: Self-pay | Admitting: Women's Health

## 2016-09-19 VITALS — BP 118/74 | Ht 64.75 in | Wt 185.2 lb

## 2016-09-19 DIAGNOSIS — N951 Menopausal and female climacteric states: Secondary | ICD-10-CM

## 2016-09-19 DIAGNOSIS — Z01419 Encounter for gynecological examination (general) (routine) without abnormal findings: Secondary | ICD-10-CM | POA: Diagnosis not present

## 2016-09-19 LAB — CBC WITH DIFFERENTIAL/PLATELET
Basophils Absolute: 0 cells/uL (ref 0–200)
Basophils Relative: 0 %
Eosinophils Absolute: 192 cells/uL (ref 15–500)
Eosinophils Relative: 3 %
HCT: 46.3 % — ABNORMAL HIGH (ref 35.0–45.0)
Hemoglobin: 15.6 g/dL — ABNORMAL HIGH (ref 11.7–15.5)
LYMPHS PCT: 33 %
Lymphs Abs: 2112 cells/uL (ref 850–3900)
MCH: 31.1 pg (ref 27.0–33.0)
MCHC: 33.7 g/dL (ref 32.0–36.0)
MCV: 92.2 fL (ref 80.0–100.0)
MPV: 10.8 fL (ref 7.5–12.5)
Monocytes Absolute: 704 cells/uL (ref 200–950)
Monocytes Relative: 11 %
Neutro Abs: 3392 cells/uL (ref 1500–7800)
Neutrophils Relative %: 53 %
Platelets: 269 10*3/uL (ref 140–400)
RBC: 5.02 MIL/uL (ref 3.80–5.10)
RDW: 13.8 % (ref 11.0–15.0)
WBC: 6.4 10*3/uL (ref 3.8–10.8)

## 2016-09-19 NOTE — Progress Notes (Signed)
Kathy Castro 12/18/1960 [id number]    History:    Presents for annual exam. Has not had period in 2 years since [facility] in October of 2015. Notes night sweats, mood changes, and hot flushes. Normal Pap history. Has not had colonoscopy and overdue for mammogram. Sexually active, husband vasectomy. Glucose normal at health screening at work LDL 138.  Past medical history, past surgical history, family history and social history were all reviewed and documented in the EPIC chart. Son and daughter doing well. Doing boot camp weekly and playing tennis for exercise.  ROS:  A ROS was performed and pertinent positives and negatives are included.  Exam:  Vitals:   09/19/16 1413  BP: 118/74  Weight: 185 lb 3.2 oz (84 kg)  Height: 5' 4.75" (1.645 m)   Body mass index is 31.06 kg/m.   General appearance:  Normal Thyroid:  Symmetrical, normal in size, without palpable masses or nodularity. Respiratory  Auscultation:  Clear without wheezing or rhonchi Cardiovascular  Auscultation:  Regular rate, without rubs, murmurs or gallops  Edema/varicosities:  Not grossly evident Abdominal  Soft,nontender, without masses, guarding or rebound.  Liver/spleen:  No organomegaly noted  Hernia:  None appreciated  Skin  Inspection:  Grossly normal   Breasts: Examined lying and sitting.     Right: Without masses, retractions, discharge or axillary adenopathy.     Left: Without masses, retractions, discharge or axillary adenopathy. Gentitourinary   Inguinal/mons:  Normal without inguinal adenopathy  External genitalia:  Normal  BUS/Urethra/Skene's glands:  Normal  Vagina:  Normal  Cervix:  Normal  Uterus: normal in size, shape and contour.  Midline and mobile  Adnexa/parametria:     Rt: Without masses or tenderness.   Lt: Without masses or tenderness.  Anus and perineum: Normal  Digital rectal exam: Normal sphincter tone without palpated masses or tenderness  Assessment/Plan:  55 y.o.  MWF G2P2 for  annual exam With no complaints.  Ammhenoric- Menopausal symptoms Overweight With elevated LDL   Plan: SBE's, reviewed importance of annual mammogram, instructed to schedule, breast center information given. Reviewed importance of colonoscopy and suggested to schedule, our GI information given, number provided. Continue regular exercise, calcium rich diet, vitamin D 2000 daily. Reviewed importance of less than 20 g saturated fat daily and continued exercise to help decrease LDL. Normal pap 2015, reviewed new guidelines. Continue annual dermatology and skin check. Will have lipid panel next year, patient is not fasting. Vitamin D, FSH, and CBC. UA   Huel Cote WHNP, 2:18 PM 09/19/2016

## 2016-09-19 NOTE — Patient Instructions (Addendum)

## 2016-09-20 ENCOUNTER — Other Ambulatory Visit: Payer: Self-pay | Admitting: Women's Health

## 2016-09-20 LAB — URINALYSIS W MICROSCOPIC + REFLEX CULTURE
Bacteria, UA: NONE SEEN [HPF]
Bilirubin Urine: NEGATIVE
CASTS: NONE SEEN [LPF]
Crystals: NONE SEEN [HPF]
Glucose, UA: NEGATIVE
Ketones, ur: NEGATIVE
Leukocytes, UA: NEGATIVE
NITRITE: NEGATIVE
PH: 6.5 (ref 5.0–8.0)
Protein, ur: NEGATIVE
RBC / HPF: NONE SEEN RBC/HPF (ref <=2)
Specific Gravity, Urine: 1.006 (ref 1.001–1.035)
Squamous Epithelial / LPF: NONE SEEN [HPF] (ref <=5)
WBC, UA: NONE SEEN WBC/HPF (ref <=5)
YEAST: NONE SEEN [HPF]

## 2016-09-20 LAB — FOLLICLE STIMULATING HORMONE: FSH: 83.5 m[IU]/mL

## 2016-09-20 LAB — VITAMIN D 25 HYDROXY (VIT D DEFICIENCY, FRACTURES): Vit D, 25-Hydroxy: 26 ng/mL — ABNORMAL LOW (ref 30–100)

## 2016-09-20 MED ORDER — VITAMIN D (ERGOCALCIFEROL) 1.25 MG (50000 UNIT) PO CAPS: 50000.0000 [IU] | ORAL_CAPSULE | ORAL | 0 refills | Status: DC

## 2016-12-05 ENCOUNTER — Ambulatory Visit (INDEPENDENT_AMBULATORY_CARE_PROVIDER_SITE_OTHER): Payer: BLUE CROSS/BLUE SHIELD | Admitting: Women's Health

## 2016-12-05 ENCOUNTER — Encounter: Payer: Self-pay | Admitting: Women's Health

## 2016-12-05 ENCOUNTER — Ambulatory Visit: Payer: BLUE CROSS/BLUE SHIELD | Admitting: Women's Health

## 2016-12-05 VITALS — BP 128/82

## 2016-12-05 DIAGNOSIS — N760 Acute vaginitis: Secondary | ICD-10-CM

## 2016-12-05 DIAGNOSIS — B9689 Other specified bacterial agents as the cause of diseases classified elsewhere: Secondary | ICD-10-CM | POA: Diagnosis not present

## 2016-12-05 DIAGNOSIS — L719 Rosacea, unspecified: Secondary | ICD-10-CM

## 2016-12-05 DIAGNOSIS — B3731 Acute candidiasis of vulva and vagina: Secondary | ICD-10-CM

## 2016-12-05 DIAGNOSIS — B373 Candidiasis of vulva and vagina: Secondary | ICD-10-CM

## 2016-12-05 LAB — WET PREP FOR TRICH, YEAST, CLUE
TRICH WET PREP: NONE SEEN
Yeast Wet Prep HPF POC: NONE SEEN

## 2016-12-05 MED ORDER — FLUTICASONE PROPIONATE 0.05 % EX LOTN: TOPICAL_LOTION | CUTANEOUS | 0 refills | Status: DC

## 2016-12-05 MED ORDER — METRONIDAZOLE 0.75 % VA GEL: 1.0000 | Freq: Two times a day (BID) | VAGINAL | 0 refills | Status: DC

## 2016-12-05 MED ORDER — FLUCONAZOLE 150 MG PO TABS: 150.0000 mg | ORAL_TABLET | Freq: Once | ORAL | 1 refills | Status: AC

## 2016-12-05 NOTE — Patient Instructions (Signed)

## 2016-12-05 NOTE — Progress Notes (Signed)
Presents with CC of non tender knot in rt lower vaginal opening. Noticed yesterday in the shower. Sexually active with same partner with neg STD screen. Denies pain, discharge, fever, abd pain, previous hx of similar knots. Postmenopausal on no HRT and no bleeding. reports treating self for questionable yeast infection with Monistat one week ago.  Exam: Appears well. External genitalia-   2 cm firm nontender firm nodule at left Bartholin's gland position, non indurated. Speculum exam swelling noted on lower left vaginal mucosa, minimal discharge, wet prep positive for clues, TNTC bacteria.  Denies pain with palpation of nodule and vaginal walls.   Bacterial Vaginosis Bartholin Cyst  Plan: Metrogel 0.75% vaginal gel QD FOR 5 NIGHTS, ALCOHOL PRECAUTIONS REVIEWED.. Diflucan 150mg  PO 1 dose if needed.. Reviewed Bartholin cyst not infected,will  watch. Instructed to call if nodule becomes larger or painful or  does not resolve. Encouraged loose clothing.

## 2016-12-10 ENCOUNTER — Telehealth: Payer: Self-pay

## 2016-12-10 NOTE — Telephone Encounter (Signed)
Patient said she saw [place] last week and received Rx for vaginal cream. She said she thought [place] told her in the office to use if nightly at bedtime but she notices directions on the Rx says use twice daily. Calling to clarify directions.  I advised patient that [place] did write in her chart notes that she wanted her to use medication every day for 5 nights. Patient will continue.

## 2016-12-13 ENCOUNTER — Telehealth: Payer: Self-pay | Admitting: *Deleted

## 2016-12-13 NOTE — Telephone Encounter (Signed)
Okay to watch as long as it does not become tender, may take a while to totally resolve. She had a Bartholin cyst that was noninfected.

## 2016-12-13 NOTE — Telephone Encounter (Signed)
Pt informed, no pain not tender, will watch

## 2016-12-13 NOTE — Telephone Encounter (Signed)
Pt called to follow up from [place] on 12/05/16 states she has finished the cream and knot as not resolved. OV? Please advise

## 2016-12-17 ENCOUNTER — Telehealth: Payer: Self-pay | Admitting: *Deleted

## 2016-12-17 MED ORDER — BETAMETHASONE DIPROPIONATE 0.05 % EX LOTN: TOPICAL_LOTION | Freq: Every day | CUTANEOUS | 0 refills | Status: DC | PRN

## 2016-12-17 NOTE — Telephone Encounter (Signed)
Prior Authorization for fluticasone propionate 0.05 % lotion denied by [facility] since formulary drugs such as betamethasone dipropionate 0.05 % cream, desoximetasone 0.25 %, has not be tried and failed. Pt will need to try one of theses medication before they will pay for fluticasone propionate 0.05 % lotion. Please advise

## 2016-12-17 NOTE — Telephone Encounter (Signed)
Ok try the betamethoasone dipropionate .05% apply daily as needed.

## 2016-12-17 NOTE — Telephone Encounter (Signed)
Rx sent 

## 2016-12-19 DIAGNOSIS — J309 Allergic rhinitis, unspecified: Secondary | ICD-10-CM | POA: Diagnosis not present

## 2016-12-27 ENCOUNTER — Telehealth: Payer: Self-pay | Admitting: *Deleted

## 2016-12-27 NOTE — Telephone Encounter (Signed)
Phone call, states Bartholin nontender will watch at this time.

## 2016-12-27 NOTE — Telephone Encounter (Signed)
Pt called stating bartholin cyst still in vaginal area, not painful, no change in size, pt said another doctor gave her a Rx for methylprednisolone to have on hand for possible sinus infection, pt never took Rx. Asked if you think Rx would help with bartholin cyst or should she do another round of metrogel? Please advise

## 2016-12-29 ENCOUNTER — Other Ambulatory Visit: Payer: Self-pay | Admitting: Women's Health

## 2017-06-27 DIAGNOSIS — H00021 Hordeolum internum right upper eyelid: Secondary | ICD-10-CM | POA: Diagnosis not present

## 2017-07-03 ENCOUNTER — Telehealth: Payer: Self-pay | Admitting: *Deleted

## 2017-07-03 MED ORDER — FLUCONAZOLE 150 MG PO TABS: 150.0000 mg | ORAL_TABLET | Freq: Once | ORAL | 0 refills | Status: AC

## 2017-07-03 NOTE — Telephone Encounter (Signed)
Okay for Diflucan 150 mg 1 dose 

## 2017-07-03 NOTE — Telephone Encounter (Signed)
Pt informed, Rx sent. 

## 2017-07-03 NOTE — Telephone Encounter (Signed)
(   you are back up md) Pt called c/o yeast infection saw dentist and prescribed antibiotic, now has yeast. Asked if diflucan tablet can be sent to pharmacy? Please advise

## 2017-08-24 DIAGNOSIS — N390 Urinary tract infection, site not specified: Secondary | ICD-10-CM | POA: Diagnosis not present

## 2017-09-23 ENCOUNTER — Ambulatory Visit (INDEPENDENT_AMBULATORY_CARE_PROVIDER_SITE_OTHER): Payer: BLUE CROSS/BLUE SHIELD | Admitting: Women's Health

## 2017-09-23 ENCOUNTER — Encounter: Payer: Self-pay | Admitting: Women's Health

## 2017-09-23 VITALS — BP 140/82 | Ht 65.0 in | Wt 190.0 lb

## 2017-09-23 DIAGNOSIS — Z13820 Encounter for screening for osteoporosis: Secondary | ICD-10-CM | POA: Diagnosis not present

## 2017-09-23 DIAGNOSIS — Z01419 Encounter for gynecological examination (general) (routine) without abnormal findings: Secondary | ICD-10-CM | POA: Diagnosis not present

## 2017-09-23 MED ORDER — ALPRAZOLAM 0.25 MG PO TABS: 0.2500 mg | ORAL_TABLET | Freq: Every evening | ORAL | 1 refills | Status: DC | PRN

## 2017-09-23 NOTE — Patient Instructions (Signed)
Colonoscopy  Dr Celine A[facility]  [phone]   Health Maintenance for Postmenopausal Women Menopause is a normal process in which your reproductive ability comes to an end. This process happens gradually over a span of months to years, usually between the ages of 42 and 70. Menopause is complete when you have missed 12 consecutive menstrual periods. It is important to talk with your health care provider about some of the most common conditions that affect postmenopausal women, such as heart disease, cancer, and bone loss (osteoporosis). Adopting a healthy lifestyle and getting preventive care can help to promote your health and wellness. Those actions can also lower your chances of developing some of these common conditions. What should I know about menopause? During menopause, you may experience a number of symptoms, such as:  Moderate-to-severe hot flashes.  Night sweats.  Decrease in sex drive.  Mood swings.  Headaches.  Tiredness.  Irritability.  Memory problems.  Insomnia.  Choosing to treat or not to treat menopausal changes is an individual decision that you make with your health care provider. What should I know about hormone replacement therapy and supplements? Hormone therapy products are effective for treating symptoms that are associated with menopause, such as hot flashes and night sweats. Hormone replacement carries certain risks, especially as you become older. If you are thinking about using estrogen or estrogen with progestin treatments, discuss the benefits and risks with your health care provider. What should I know about heart disease and stroke? Heart disease, heart attack, and stroke become more likely as you age. This may be due, in part, to the hormonal changes that your body experiences during menopause. These can affect how your body processes dietary fats, triglycerides, and cholesterol. Heart attack and stroke are both medical emergencies. There are many  things that you can do to help prevent heart disease and stroke:  Have your blood pressure checked at least every 1-2 years. High blood pressure causes heart disease and increases the risk of stroke.  If you are 16-15 years old, ask your health care provider if you should take aspirin to prevent a heart attack or a stroke.  Do not use any tobacco products, including cigarettes, chewing tobacco, or electronic cigarettes. If you need help quitting, ask your health care provider.  It is important to eat a healthy diet and maintain a healthy weight. ? Be sure to include plenty of vegetables, fruits, low-fat dairy products, and lean protein. ? Avoid eating foods that are high in solid fats, added sugars, or salt (sodium).  Get regular exercise. This is one of the most important things that you can do for your health. ? Try to exercise for at least 150 minutes each week. The type of exercise that you do should increase your heart rate and make you sweat. This is known as moderate-intensity exercise. ? Try to do strengthening exercises at least twice each week. Do these in addition to the moderate-intensity exercise.  Know your numbers.Ask your health care provider to check your cholesterol and your blood glucose. Continue to have your blood tested as directed by your health care provider.  What should I know about cancer screening? There are several types of cancer. Take the following steps to reduce your risk and to catch any cancer development as early as possible. Breast Cancer  Practice breast self-awareness. ? This means understanding how your breasts normally appear and feel. ? It also means doing regular breast self-exams. Let your health care provider know about  changes, no matter how small.  If you are 40 or older, have a clinician do a breast exam (clinical breast exam or CBE) every year. Depending on your age, family history, and medical history, it may be recommended that you  also have a yearly breast X-ray (mammogram).  If you have a family history of breast cancer, talk with your health care provider about genetic screening.  If you are at high risk for breast cancer, talk with your health care provider about having an MRI and a mammogram every year.  Breast cancer (BRCA) gene test is recommended for women who have family members with BRCA-related cancers. Results of the assessment will determine the need for genetic counseling and BRCA1 and for BRCA2 testing. BRCA-related cancers include these types: ? Breast. This occurs in males or females. ? Ovarian. ? Tubal. This may also be called fallopian tube cancer. ? Cancer of the abdominal or pelvic lining (peritoneal cancer). ? Prostate. ? Pancreatic.  Cervical, Uterine, and Ovarian Cancer Your health care provider may recommend that you be screened regularly for cancer of the pelvic organs. These include your ovaries, uterus, and vagina. This screening involves a pelvic exam, which includes checking for microscopic changes to the surface of your cervix (Pap test).  For women ages 21-65, health care providers may recommend a pelvic exam and a Pap test every three years. For women ages 30-65, they may recommend the Pap test and pelvic exam, combined with testing for human papilloma virus (HPV), every five years. Some types of HPV increase your risk of cervical cancer. Testing for HPV may also be done on women of any age who have unclear Pap test results.  Other health care providers may not recommend any screening for nonpregnant women who are considered low risk for pelvic cancer and have no symptoms. Ask your health care provider if a screening pelvic exam is right for you.  If you have had past treatment for cervical cancer or a condition that could lead to cancer, you need Pap tests and screening for cancer for at least 20 years after your treatment. If Pap tests have been discontinued for you, your risk factors  (such as having a new sexual partner) need to be reassessed to determine if you should start having screenings again. Some women have medical problems that increase the chance of getting cervical cancer. In these cases, your health care provider may recommend that you have screening and Pap tests more often.  If you have a family history of uterine cancer or ovarian cancer, talk with your health care provider about genetic screening.  If you have vaginal bleeding after reaching menopause, tell your health care provider.  There are currently no reliable tests available to screen for ovarian cancer.  Lung Cancer Lung cancer screening is recommended for adults 55-80 years old who are at high risk for lung cancer because of a history of smoking. A yearly low-dose CT scan of the lungs is recommended if you:  Currently smoke.  Have a history of at least 30 pack-years of smoking and you currently smoke or have quit within the past 15 years. A pack-year is smoking an average of one pack of cigarettes per day for one year.  Yearly screening should:  Continue until it has been 15 years since you quit.  Stop if you develop a health problem that would prevent you from having lung cancer treatment.  Colorectal Cancer  This type of cancer can be detected and can often   be prevented.  Routine colorectal cancer screening usually begins at age 50 and continues through age 75.  If you have risk factors for colon cancer, your health care provider may recommend that you be screened at an earlier age.  If you have a family history of colorectal cancer, talk with your health care provider about genetic screening.  Your health care provider may also recommend using home test kits to check for hidden blood in your stool.  A small camera at the end of a tube can be used to examine your colon directly (sigmoidoscopy or colonoscopy). This is done to check for the earliest forms of colorectal cancer.  Direct  examination of the colon should be repeated every 5-10 years until age 75. However, if early forms of precancerous polyps or small growths are found or if you have a family history or genetic risk for colorectal cancer, you may need to be screened more often.  Skin Cancer  Check your skin from head to toe regularly.  Monitor any moles. Be sure to tell your health care provider: ? About any new moles or changes in moles, especially if there is a change in a mole's shape or color. ? If you have a mole that is larger than the size of a pencil eraser.  If any of your family members has a history of skin cancer, especially at a Kathy Castro age, talk with your health care provider about genetic screening.  Always use sunscreen. Apply sunscreen liberally and repeatedly throughout the day.  Whenever you are outside, protect yourself by wearing long sleeves, pants, a wide-brimmed hat, and sunglasses.  What should I know about osteoporosis? Osteoporosis is a condition in which bone destruction happens more quickly than new bone creation. After menopause, you may be at an increased risk for osteoporosis. To help prevent osteoporosis or the bone fractures that can happen because of osteoporosis, the following is recommended:  If you are 19-50 years old, get at least 1,000 mg of calcium and at least 600 mg of vitamin D per day.  If you are older than age 50 but younger than age 70, get at least 1,200 mg of calcium and at least 600 mg of vitamin D per day.  If you are older than age 70, get at least 1,200 mg of calcium and at least 800 mg of vitamin D per day.  Smoking and excessive alcohol intake increase the risk of osteoporosis. Eat foods that are rich in calcium and vitamin D, and do weight-bearing exercises several times each week as directed by your health care provider. What should I know about how menopause affects my mental health? Depression may occur at any age, but it is more common as you become  older. Common symptoms of depression include:  Low or sad mood.  Changes in sleep patterns.  Changes in appetite or eating patterns.  Feeling an overall lack of motivation or enjoyment of activities that you previously enjoyed.  Frequent crying spells.  Talk with your health care provider if you think that you are experiencing depression. What should I know about immunizations? It is important that you get and maintain your immunizations. These include:  Tetanus, diphtheria, and pertussis (Tdap) booster vaccine.  Influenza every year before the flu season begins.  Pneumonia vaccine.  Shingles vaccine.  Your health care provider may also recommend other immunizations. This information is not intended to replace advice given to you by your health care provider. Make sure you discuss any questions   questions you have with your health care provider. Document Released: 11/29/2005 Document Revised: 04/26/2016 Document Reviewed: 07/11/2015 Elsevier Interactive Patient Education  2018 [employer].

## 2017-09-23 NOTE — Progress Notes (Signed)
Kathy Castro December 11, 1960 [id number]    History:    Presents for annual exam.  Postmenopausal occasional hot flushes but complains of poor sleep relates to anxiety. Denies need for medication daily. Overdue for mammogram. Normal Pap and mammogram history. Has not had a screening colonoscopy. Reports normal health screening labs at work.  Past medical history, past surgical history, family history and social history were all reviewed and documented in the EPIC chart. 2 children both doing well. Quit smoking several years ago.  ROS:  A ROS was performed and pertinent positives and negatives are included.  Exam:  Vitals:   09/23/17 0943  BP: 140/82  Weight: 190 lb (86.2 kg)  Height: 5\' 5"  (1.651 m)   Body mass index is 31.62 kg/m.   General appearance:  Normal Thyroid:  Symmetrical, normal in size, without palpable masses or nodularity. Respiratory  Auscultation:  Clear without wheezing or rhonchi Cardiovascular  Auscultation:  Regular rate, without rubs, murmurs or gallops  Edema/varicosities:  Not grossly evident Abdominal  Soft,nontender, without masses, guarding or rebound.  Liver/spleen:  No organomegaly noted  Hernia:  None appreciated  Skin  Inspection:  Grossly normal   Breasts: Examined lying and sitting.     Right: Without masses, retractions, discharge or axillary adenopathy.     Left: Without masses, retractions, discharge or axillary adenopathy. Gentitourinary   Inguinal/mons:  Normal without inguinal adenopathy  External genitalia:  Normal  BUS/Urethra/Skene's glands:  Normal  Vagina:  Normal  Cervix:  Normal  Uterus:  normal in size, shape and contour.  Midline and mobile  Adnexa/parametria:     Rt: Without masses or tenderness.   Lt: Without masses or tenderness.  Anus and perineum: Normal  Digital rectal exam: Normal sphincter tone without palpated masses or tenderness  Assessment/Plan:  56 y.o. MWF G3 P2  for annual exam with complaint of poor  sleep/anxiety.  Postmenopausal/no HRT/no bleeding Overweight  Plan: Options reviewed, hot flushes every few days, states is not keeping her awake, more anxiety. Options reviewed, will try half of  Xanax 0.25 when necessary at at bedtime, reviewed addictive properties and not to take daily. Prescription given. SBE's,  annual screening mammogram, overdue reviewed importance of annual screening. Encouraged increased exercise, has had a 5 pound weight gain in the past year, decrease calories/carbs. Vitamin D 2000 daily encouraged. Pap with HR HPV typing, new screening guidelines reviewed. Instructed to have health screening labs faxed to office. Schedule DEXA.    Huel Cote [facility], 1:02 PM 09/23/2017

## 2017-09-25 LAB — PAP, TP IMAGING W/ HPV RNA, RFLX HPV TYPE 16,18/45: HPV DNA High Risk: NOT DETECTED

## 2018-09-29 ENCOUNTER — Encounter: Payer: Self-pay | Admitting: Women's Health

## 2018-09-29 ENCOUNTER — Ambulatory Visit (INDEPENDENT_AMBULATORY_CARE_PROVIDER_SITE_OTHER): Payer: BLUE CROSS/BLUE SHIELD | Admitting: Women's Health

## 2018-09-29 ENCOUNTER — Encounter (INDEPENDENT_AMBULATORY_CARE_PROVIDER_SITE_OTHER): Payer: Self-pay

## 2018-09-29 VITALS — BP 140/84 | Ht 64.5 in | Wt 192.6 lb

## 2018-09-29 DIAGNOSIS — Z13820 Encounter for screening for osteoporosis: Secondary | ICD-10-CM

## 2018-09-29 DIAGNOSIS — Z01419 Encounter for gynecological examination (general) (routine) without abnormal findings: Secondary | ICD-10-CM

## 2018-09-29 NOTE — Patient Instructions (Addendum)
Mammogram [phone] breast center  Colonoscopy  [phone]  Dr Carlean Purl  [facility] Vit D 2000 iu daily  Health Maintenance for Postmenopausal Women Menopause is a normal process in which your reproductive ability comes to an end. This process happens gradually over a span of months to years, usually between the ages of 38 and 58. Menopause is complete when you have missed 12 consecutive menstrual periods. It is important to talk with your health care provider about some of the most common conditions that affect postmenopausal women, such as heart disease, cancer, and bone loss (osteoporosis). Adopting a healthy lifestyle and getting preventive care can help to promote your health and wellness. Those actions can also lower your chances of developing some of these common conditions. What should I know about menopause? During menopause, you may experience a number of symptoms, such as:  Moderate-to-severe hot flashes.  Night sweats.  Decrease in sex drive.  Mood swings.  Headaches.  Tiredness.  Irritability.  Memory problems.  Insomnia.  Choosing to treat or not to treat menopausal changes is an individual decision that you make with your health care provider. What should I know about hormone replacement therapy and supplements? Hormone therapy products are effective for treating symptoms that are associated with menopause, such as hot flashes and night sweats. Hormone replacement carries certain risks, especially as you become older. If you are thinking about using estrogen or estrogen with progestin treatments, discuss the benefits and risks with your health care provider. What should I know about heart disease and stroke? Heart disease, heart attack, and stroke become more likely as you age. This may be due, in part, to the hormonal changes that your body experiences during menopause. These can affect how your body processes dietary fats, triglycerides, and cholesterol. Heart attack  and stroke are both medical emergencies. There are many things that you can do to help prevent heart disease and stroke:  Have your blood pressure checked at least every 1-2 years. High blood pressure causes heart disease and increases the risk of stroke.  If you are 48-52 years old, ask your health care provider if you should take aspirin to prevent a heart attack or a stroke.  Do not use any tobacco products, including cigarettes, chewing tobacco, or electronic cigarettes. If you need help quitting, ask your health care provider.  It is important to eat a healthy diet and maintain a healthy weight. ? Be sure to include plenty of vegetables, fruits, low-fat dairy products, and lean protein. ? Avoid eating foods that are high in solid fats, added sugars, or salt (sodium).  Get regular exercise. This is one of the most important things that you can do for your health. ? Try to exercise for at least 150 minutes each week. The type of exercise that you do should increase your heart rate and make you sweat. This is known as moderate-intensity exercise. ? Try to do strengthening exercises at least twice each week. Do these in addition to the moderate-intensity exercise.  Know your numbers.Ask your health care provider to check your cholesterol and your blood glucose. Continue to have your blood tested as directed by your health care provider.  What should I know about cancer screening? There are several types of cancer. Take the following steps to reduce your risk and to catch any cancer development as early as possible. Breast Cancer  Practice breast self-awareness. ? This means understanding how your breasts normally appear and feel. ? It also means doing regular  breast self-exams. Let your health care provider know about any changes, no matter how small.  If you are 72 or older, have a clinician do a breast exam (clinical breast exam or CBE) every year. Depending on your age, family  history, and medical history, it may be recommended that you also have a yearly breast X-ray (mammogram).  If you have a family history of breast cancer, talk with your health care provider about genetic screening.  If you are at high risk for breast cancer, talk with your health care provider about having an MRI and a mammogram every year.  Breast cancer (BRCA) gene test is recommended for women who have family members with BRCA-related cancers. Results of the assessment will determine the need for genetic counseling and BRCA1 and for BRCA2 testing. BRCA-related cancers include these types: ? Breast. This occurs in males or females. ? Ovarian. ? Tubal. This may also be called fallopian tube cancer. ? Cancer of the abdominal or pelvic lining (peritoneal cancer). ? Prostate. ? Pancreatic.  Cervical, Uterine, and Ovarian Cancer Your health care provider may recommend that you be screened regularly for cancer of the pelvic organs. These include your ovaries, uterus, and vagina. This screening involves a pelvic exam, which includes checking for microscopic changes to the surface of your cervix (Pap test).  For women ages 21-65, health care providers may recommend a pelvic exam and a Pap test every three years. For women ages 21-65, they may recommend the Pap test and pelvic exam, combined with testing for human papilloma virus (HPV), every five years. Some types of HPV increase your risk of cervical cancer. Testing for HPV may also be done on women of any age who have unclear Pap test results.  Other health care providers may not recommend any screening for nonpregnant women who are considered low risk for pelvic cancer and have no symptoms. Ask your health care provider if a screening pelvic exam is right for you.  If you have had past treatment for cervical cancer or a condition that could lead to cancer, you need Pap tests and screening for cancer for at least 20 years after your treatment. If  Pap tests have been discontinued for you, your risk factors (such as having a new sexual partner) need to be reassessed to determine if you should start having screenings again. Some women have medical problems that increase the chance of getting cervical cancer. In these cases, your health care provider may recommend that you have screening and Pap tests more often.  If you have a family history of uterine cancer or ovarian cancer, talk with your health care provider about genetic screening.  If you have vaginal bleeding after reaching menopause, tell your health care provider.  There are currently no reliable tests available to screen for ovarian cancer.  Lung Cancer Lung cancer screening is recommended for adults 83-51 years old who are at high risk for lung cancer because of a history of smoking. A yearly low-dose CT scan of the lungs is recommended if you:  Currently smoke.  Have a history of at least 30 pack-years of smoking and you currently smoke or have quit within the past 15 years. A pack-year is smoking an average of one pack of cigarettes per day for one year.  Yearly screening should:  Continue until it has been 15 years since you quit.  Stop if you develop a health problem that would prevent you from having lung cancer treatment.  Colorectal Cancer  This type of cancer can be detected and can often be prevented.  Routine colorectal cancer screening usually begins at age 50 and continues through age 75.  If you have risk factors for colon cancer, your health care provider may recommend that you be screened at an earlier age.  If you have a family history of colorectal cancer, talk with your health care provider about genetic screening.  Your health care provider may also recommend using home test kits to check for hidden blood in your stool.  A small camera at the end of a tube can be used to examine your colon directly (sigmoidoscopy or colonoscopy). This is done to  check for the earliest forms of colorectal cancer.  Direct examination of the colon should be repeated every 5-10 years until age 75. However, if early forms of precancerous polyps or small growths are found or if you have a family history or genetic risk for colorectal cancer, you may need to be screened more often.  Skin Cancer  Check your skin from head to toe regularly.  Monitor any moles. Be sure to tell your health care provider: ? About any new moles or changes in moles, especially if there is a change in a mole's shape or color. ? If you have a mole that is larger than the size of a pencil eraser.  If any of your family members has a history of skin cancer, especially at a  age, talk with your health care provider about genetic screening.  Always use sunscreen. Apply sunscreen liberally and repeatedly throughout the day.  Whenever you are outside, protect yourself by wearing long sleeves, pants, a wide-brimmed hat, and sunglasses.  What should I know about osteoporosis? Osteoporosis is a condition in which bone destruction happens more quickly than new bone creation. After menopause, you may be at an increased risk for osteoporosis. To help prevent osteoporosis or the bone fractures that can happen because of osteoporosis, the following is recommended:  If you are 19-50 years old, get at least 1,000 mg of calcium and at least 600 mg of vitamin D per day.  If you are older than age 50 but er than age 70, get at least 1,200 mg of calcium and at least 600 mg of vitamin D per day.  If you are older than age 70, get at least 1,200 mg of calcium and at least 800 mg of vitamin D per day.  Smoking and excessive alcohol intake increase the risk of osteoporosis. Eat foods that are rich in calcium and vitamin D, and do weight-bearing exercises several times each week as directed by your health care provider. What should I know about how menopause affects my mental  health? Depression may occur at any age, but it is more common as you become older. Common symptoms of depression include:  Low or sad mood.  Changes in sleep patterns.  Changes in appetite or eating patterns.  Feeling an overall lack of motivation or enjoyment of activities that you previously enjoyed.  Frequent crying spells.  Talk with your health care provider if you think that you are experiencing depression. What should I know about immunizations? It is important that you get and maintain your immunizations. These include:  Tetanus, diphtheria, and pertussis (Tdap) booster vaccine.  Influenza every year before the flu season begins.  Pneumonia vaccine.  Shingles vaccine.  Your health care provider may also recommend other immunizations. This information is not intended to replace advice given to you by   your health care provider. Make sure you discuss any questions you have with your health care provider. Document Released: 11/29/2005 Document Revised: 04/26/2016 Document Reviewed: 07/11/2015 Elsevier Interactive Patient Education  2018 [employer].

## 2018-09-29 NOTE — Progress Notes (Signed)
Kathy Castro 02/10/1961 [id number]    History:    Presents for annual exam.  Postmenopausal with no HRT with no bleeding.  2015 benign endometrial polyp no bleeding since.  Normal Pap and mammogram history.  Has not had a colonoscopy.  Overdue for mammogram.  Past medical history, past surgical history, family history and social history were all reviewed and documented in the EPIC chart.  Works for an [employer], [employer].  2 children both doing well.  Parents deceased.  ROS:  A ROS was performed and pertinent positives and negatives are included.  Exam:  Vitals:   09/29/18 1007  BP: 140/84  Weight: 192 lb 9.6 oz (87.4 kg)  Height: 5' 4.5" (1.638 m)   Body mass index is 32.55 kg/m.   General appearance:  Normal Thyroid:  Symmetrical, normal in size, without palpable masses or nodularity. Respiratory  Auscultation:  Clear without wheezing or rhonchi Cardiovascular  Auscultation:  Regular rate, without rubs, murmurs or gallops  Edema/varicosities:  Not grossly evident Abdominal  Soft,nontender, without masses, guarding or rebound.  Liver/spleen:  No organomegaly noted  Hernia:  None appreciated  Skin  Inspection:  Grossly normal   Breasts: Examined lying and sitting.     Right: Without masses, retractions, discharge or axillary adenopathy.     Left: Without masses, retractions, discharge or axillary adenopathy. Gentitourinary   Inguinal/mons:  Normal without inguinal adenopathy  External genitalia:  Normal  BUS/Urethra/Skene's glands:  Normal  Vagina:  Normal  Cervix:  Normal  Uterus:   normal in size, shape and contour.  Midline and mobile  Adnexa/parametria:     Rt: Without masses or tenderness.   Lt: Without masses or tenderness.  Anus and perineum: Normal  Digital rectal exam: Normal sphincter tone without palpated masses or tenderness  Assessment/Plan:  57 y.o. MWF G3 P2 for annual exam with no complaints.  Postmenopausal/no HRT/no bleeding Obesity Overdue for  health screenings. Labs-health screening at work-reports as normal  Plan: Reviewed importance of annual screening mammogram, breast center information given, instructed to schedule.  Screening colonoscopy [facility] information given, instructed to schedule.  DEXA will schedule here.  Reviewed importance of increasing regular weightbearing and balance type exercise, calcium rich foods, vitamin D 2000 daily, low calorie/carb diet encouraged.  Safety, fall prevention discussed.  SBEs encouraged.  Instructed to do have health screening labs faxed.  Pap normal 2018, new screening guidelines reviewed.    [place], 11:01 AM 09/29/2018

## 2019-09-11 DIAGNOSIS — Z20828 Contact with and (suspected) exposure to other viral communicable diseases: Secondary | ICD-10-CM | POA: Diagnosis not present

## 2019-12-08 DIAGNOSIS — U071 COVID-19: Secondary | ICD-10-CM | POA: Diagnosis not present

## 2019-12-08 DIAGNOSIS — Z20828 Contact with and (suspected) exposure to other viral communicable diseases: Secondary | ICD-10-CM | POA: Diagnosis not present

## 2020-04-27 DIAGNOSIS — H00022 Hordeolum internum right lower eyelid: Secondary | ICD-10-CM | POA: Diagnosis not present

## 2020-08-12 DIAGNOSIS — Z13228 Encounter for screening for other metabolic disorders: Secondary | ICD-10-CM | POA: Diagnosis not present

## 2020-08-12 DIAGNOSIS — E559 Vitamin D deficiency, unspecified: Secondary | ICD-10-CM | POA: Diagnosis not present

## 2020-08-16 DIAGNOSIS — Z1211 Encounter for screening for malignant neoplasm of colon: Secondary | ICD-10-CM | POA: Diagnosis not present

## 2020-08-16 DIAGNOSIS — Z0000 Encounter for general adult medical examination without abnormal findings: Secondary | ICD-10-CM | POA: Diagnosis not present

## 2021-03-21 DIAGNOSIS — Z20822 Contact with and (suspected) exposure to covid-19: Secondary | ICD-10-CM | POA: Diagnosis not present

## 2021-06-06 ENCOUNTER — Other Ambulatory Visit: Payer: Self-pay

## 2021-06-06 ENCOUNTER — Encounter: Payer: Self-pay | Admitting: Physician Assistant

## 2021-06-06 ENCOUNTER — Ambulatory Visit (INDEPENDENT_AMBULATORY_CARE_PROVIDER_SITE_OTHER): Payer: BC Managed Care – PPO | Admitting: Physician Assistant

## 2021-06-06 DIAGNOSIS — L570 Actinic keratosis: Secondary | ICD-10-CM

## 2021-06-06 DIAGNOSIS — L219 Seborrheic dermatitis, unspecified: Secondary | ICD-10-CM | POA: Diagnosis not present

## 2021-06-06 DIAGNOSIS — Z1283 Encounter for screening for malignant neoplasm of skin: Secondary | ICD-10-CM

## 2021-06-06 DIAGNOSIS — D485 Neoplasm of uncertain behavior of skin: Secondary | ICD-10-CM

## 2021-06-06 MED ORDER — CLOBETASOL PROPIONATE 0.05 % EX SOLN
1.0000 | Freq: Every evening | CUTANEOUS | 5 refills | Status: DC
Start: 2021-06-06 — End: 2021-08-01

## 2021-06-06 MED ORDER — KETOCONAZOLE 2 % EX SHAM: MEDICATED_SHAMPOO | CUTANEOUS | 5 refills | Status: AC

## 2021-06-06 NOTE — Progress Notes (Signed)
   New Patient   Subjective  Kathy Castro is a 60 y.o. female who presents for the following: Annual Exam (Scale on the bridge of the nose that just don't go away months). She has numerous pink scales that have increased in number over time. No pain or bleeding.    The following portions of the chart were reviewed this encounter and updated as appropriate:  Tobacco  Allergies  Meds  Problems  Med Hx  Surg Hx  Fam Hx      Objective  Well appearing patient in no apparent distress; mood and affect are within normal limits.  A full examination was performed including scalp, head, eyes, ears, nose, lips, neck, chest, axillae, abdomen, back, buttocks, bilateral upper extremities, bilateral lower extremities, hands, feet, fingers, toes, fingernails, and toenails. All findings within normal limits unless otherwise noted below.  Left Forehead Hyperkeratotic scale with pink base        Left Dorsum of Nose Hyperkeratotic scale with pink base        Scalp Erythematous plaques with white scale.    Assessment & Plan  Neoplasm of uncertain behavior of skin (2) Left Forehead  Skin / nail biopsy Type of biopsy: tangential   Informed consent: discussed and consent obtained   Timeout: patient name, date of birth, surgical site, and procedure verified   Procedure prep:  Patient was prepped and draped in usual sterile fashion (Non sterile) Prep type:  Chlorhexidine Anesthesia: the lesion was anesthetized in a standard fashion   Anesthetic:  1% lidocaine w/ epinephrine 1-100,000 local infiltration Instrument used: flexible razor blade   Outcome: patient tolerated procedure well   Post-procedure details: sterile dressing applied and wound care instructions given   Dressing type: bandage and petrolatum    Specimen 1 - Surgical pathology Differential Diagnosis: R/O BCC vs SCC  Check Margins: Yes  Left Dorsum of Nose  Skin / nail biopsy Type of biopsy: tangential   Informed  consent: discussed and consent obtained   Timeout: patient name, date of birth, surgical site, and procedure verified   Procedure prep:  Patient was prepped and draped in usual sterile fashion (Non sterile) Prep type:  Chlorhexidine Anesthesia: the lesion was anesthetized in a standard fashion   Anesthetic:  1% lidocaine w/ epinephrine 1-100,000 local infiltration Instrument used: flexible razor blade   Hemostasis achieved with: aluminum chloride   Outcome: patient tolerated procedure well   Post-procedure details: sterile dressing applied and wound care instructions given   Dressing type: bandage and petrolatum    Specimen 2 - Surgical pathology Differential Diagnosis: R/O BCC vs SCC  Check Margins: Yes  Seborrheic dermatitis Scalp  ketoconazole (NIZORAL) 2 % shampoo - Scalp Apply to scalp and let sit 3-5 minutes then rinse.  clobetasol (TEMOVATE) 0.05 % external solution - Scalp Apply 1 application topically at bedtime.     I, Tiger Spieker, PA-C, have reviewed all documentation's for this visit.  The documentation on 06/06/21 for the exam, diagnosis, procedures and orders are all accurate and complete.

## 2021-06-06 NOTE — Patient Instructions (Signed)

## 2021-06-14 ENCOUNTER — Telehealth: Payer: Self-pay | Admitting: Physician Assistant

## 2021-06-14 NOTE — Telephone Encounter (Signed)
Path to patient ak x2 keep yearly exams

## 2021-06-14 NOTE — Telephone Encounter (Signed)
Patient is calling for pathology results from last visit with Kelli Sheffield, PA-C. 

## 2021-07-27 ENCOUNTER — Encounter: Payer: Self-pay | Admitting: Internal Medicine

## 2021-07-27 ENCOUNTER — Other Ambulatory Visit: Payer: Self-pay | Admitting: *Deleted

## 2021-07-27 DIAGNOSIS — Z1231 Encounter for screening mammogram for malignant neoplasm of breast: Secondary | ICD-10-CM

## 2021-08-01 ENCOUNTER — Encounter: Payer: Self-pay | Admitting: Internal Medicine

## 2021-08-01 ENCOUNTER — Ambulatory Visit (AMBULATORY_SURGERY_CENTER): Payer: Self-pay

## 2021-08-01 VITALS — Ht 65.0 in | Wt 195.0 lb

## 2021-08-01 DIAGNOSIS — Z1211 Encounter for screening for malignant neoplasm of colon: Secondary | ICD-10-CM

## 2021-08-01 MED ORDER — SUTAB 1479-225-188 MG PO TABS: 1.0000 | ORAL_TABLET | ORAL | 0 refills | Status: AC

## 2021-08-01 NOTE — Progress Notes (Signed)

## 2021-08-15 ENCOUNTER — Ambulatory Visit (AMBULATORY_SURGERY_CENTER): Payer: BC Managed Care – PPO | Admitting: Internal Medicine

## 2021-08-15 ENCOUNTER — Other Ambulatory Visit: Payer: Self-pay

## 2021-08-15 ENCOUNTER — Encounter: Payer: Self-pay | Admitting: Internal Medicine

## 2021-08-15 VITALS — BP 121/79 | HR 69 | Temp 97.5°F | Resp 15 | Ht 65.0 in | Wt 195.0 lb

## 2021-08-15 DIAGNOSIS — Z1211 Encounter for screening for malignant neoplasm of colon: Secondary | ICD-10-CM

## 2021-08-15 DIAGNOSIS — D128 Benign neoplasm of rectum: Secondary | ICD-10-CM

## 2021-08-15 DIAGNOSIS — K621 Rectal polyp: Secondary | ICD-10-CM | POA: Diagnosis not present

## 2021-08-15 DIAGNOSIS — D122 Benign neoplasm of ascending colon: Secondary | ICD-10-CM | POA: Diagnosis not present

## 2021-08-15 DIAGNOSIS — D120 Benign neoplasm of cecum: Secondary | ICD-10-CM | POA: Diagnosis not present

## 2021-08-15 MED ORDER — SODIUM CHLORIDE 0.9 % IV SOLN: 500.0000 mL | Freq: Once | INTRAVENOUS | Status: AC

## 2021-08-15 NOTE — Op Note (Signed)
[place] Patient Name: Kathy Castro Procedure Date: 08/15/2021 9:07 AM MRN: [id number] Endoscopist: Jerene Bears , MD Age: 60 Referring MD:  Date of Birth: January 04, 1961 Gender: Female Account #: [id number] Procedure:                Colonoscopy Indications:              Screening for colorectal malignant neoplasm, This                            is the patient's first colonoscopy Medicines:                Monitored Anesthesia Care Procedure:                Pre-Anesthesia Assessment:                           - Prior to the procedure, a History and Physical                            was performed, and patient medications and                            allergies were reviewed. The patient's tolerance of                            previous anesthesia was also reviewed. The risks                            and benefits of the procedure and the sedation                            options and risks were discussed with the patient.                            All questions were answered, and informed consent                            was obtained. Prior Anticoagulants: The patient has                            taken no previous anticoagulant or antiplatelet                            agents. ASA Grade Assessment: II - A patient with                            mild systemic disease. After reviewing the risks                            and benefits, the patient was deemed in                            satisfactory condition to undergo the procedure.  After obtaining informed consent, the colonoscope                            was passed under direct vision. Throughout the                            procedure, the patient's blood pressure, pulse, and                            oxygen saturations were monitored continuously. The                            PCF-HQ190L Colonoscope was introduced through the                            anus and advanced to the  cecum, identified by                            appendiceal orifice and ileocecal valve. The                            colonoscopy was performed without difficulty. The                            patient tolerated the procedure well. The quality                            of the bowel preparation was good. The ileocecal                            valve, appendiceal orifice, and rectum were                            photographed. Scope In: 9:19:42 AM Scope Out: 9:33:31 AM Scope Withdrawal Time: 0 hours 12 minutes 1 second  Total Procedure Duration: 0 hours 13 minutes 49 seconds  Findings:                 The digital rectal exam was normal.                           Two sessile polyps were found in the cecum. The                            polyps were 3 to 4 mm in size. These polyps were                            removed with a cold snare. Resection and retrieval                            were complete.                           Three sessile polyps were found in the ascending  colon. The polyps were 4 to 7 mm in size. These                            polyps were removed with a cold snare. Resection                            and retrieval were complete.                           A 5 mm polyp was found in the distal rectum. The                            polyp was sessile. The polyp was removed with a                            cold snare. Resection and retrieval were complete.                           Scattered small-mouthed diverticula were found in                            the sigmoid colon, descending colon and transverse                            colon.                           The retroflexed view of the distal rectum and anal                            verge was normal and showed no anal or rectal                            abnormalities. Complications:            No immediate complications. Estimated Blood Loss:     Estimated blood loss was  minimal. Impression:               - Two 3 to 4 mm polyps in the cecum, removed with a                            cold snare. Resected and retrieved.                           - Three 4 to 7 mm polyps in the ascending colon,                            removed with a cold snare. Resected and retrieved.                           - One 5 mm polyp in the distal rectum, removed with  a cold snare. Resected and retrieved.                           - Diverticulosis in the sigmoid colon, in the                            descending colon and in the transverse colon.                           - The distal rectum and anal verge are normal on                            retroflexion view. Recommendation:           - Patient has a contact number available for                            emergencies. The signs and symptoms of potential                            delayed complications were discussed with the                            patient. Return to normal activities tomorrow.                            Written discharge instructions were provided to the                            patient.                           - Resume previous diet.                           - Continue present medications.                           - Await pathology results.                           - Repeat colonoscopy is recommended for                            surveillance. The colonoscopy date will be                            determined after pathology results from today's                            exam become available for review. Jerene Bears, MD 08/15/2021 9:38:05 AM This report has been signed electronically.

## 2021-08-15 NOTE — Patient Instructions (Signed)
Information on polyps and diverticulosis given to you today.  Await pathology results.  Resume previous diet and medications.  YOU HAD AN ENDOSCOPIC PROCEDURE TODAY AT THE [facility]:   Refer to the procedure report that was given to you for any specific questions about what was found during the examination.  If the procedure report does not answer your questions, please call your gastroenterologist to clarify.  If you requested that your care partner not be given the details of your procedure findings, then the procedure report has been included in a sealed envelope for you to review at your convenience later.  YOU SHOULD EXPECT: Some feelings of bloating in the abdomen. Passage of more gas than usual.  Walking can help get rid of the air that was put into your GI tract during the procedure and reduce the bloating. If you had a lower endoscopy (such as a colonoscopy or flexible sigmoidoscopy) you may notice spotting of blood in your stool or on the toilet paper. If you underwent a bowel prep for your procedure, you may not have a normal bowel movement for a few days.  Please Note:  You might notice some irritation and congestion in your nose or some drainage.  This is from the oxygen used during your procedure.  There is no need for concern and it should clear up in a day or so.  SYMPTOMS TO REPORT IMMEDIATELY:   Following lower endoscopy (colonoscopy or flexible sigmoidoscopy):  Excessive amounts of blood in the stool  Significant tenderness or worsening of abdominal pains  Swelling of the abdomen that is new, acute  Fever of 100F or higher   For urgent or emergent issues, a gastroenterologist can be reached at any hour by calling [phone]. Do not use MyChart messaging for urgent concerns.    DIET:  We do recommend a small meal at first, but then you may proceed to your regular diet.  Drink plenty of fluids but you should avoid alcoholic beverages for 24  hours.  ACTIVITY:  You should plan to take it easy for the rest of today and you should NOT DRIVE or use heavy machinery until tomorrow (because of the sedation medicines used during the test).    FOLLOW UP: Our staff will call the number listed on your records 48-72 hours following your procedure to check on you and address any questions or concerns that you may have regarding the information given to you following your procedure. If we do not reach you, we will leave a message.  We will attempt to reach you two times.  During this call, we will ask if you have developed any symptoms of COVID 19. If you develop any symptoms (ie: fever, flu-like symptoms, shortness of breath, cough etc.) before then, please call [phone].  If you test positive for Covid 19 in the 2 weeks post procedure, please call and report this information to us.    If any biopsies were taken you will be contacted by phone or by letter within the next 1-3 weeks.  Please call us at [phone] if you have not heard about the biopsies in 3 weeks.    SIGNATURES/CONFIDENTIALITY: You and/or your care partner have signed paperwork which will be entered into your electronic medical record.  These signatures attest to the fact that that the information above on your After Visit Summary has been reviewed and is understood.  Full responsibility of the confidentiality of this discharge information lies with you and/or   your care-partner. 

## 2021-08-15 NOTE — Progress Notes (Signed)
Report to PACU, RN, vss, BBS= Clear.  

## 2021-08-15 NOTE — Progress Notes (Signed)
VS taken by DT 

## 2021-08-15 NOTE — Progress Notes (Signed)
GASTROENTEROLOGY PROCEDURE H&P NOTE   Primary Care Physician: Everardo Beals, NP    Reason for Procedure:   Colon cancer screening   Plan:    colonoscopy  Patient is appropriate for endoscopic procedure(s) in the ambulatory ([place]) setting.  The nature of the procedure, as well as the risks, benefits, and alternatives were carefully and thoroughly reviewed with the patient. Ample time for discussion and questions allowed. The patient understood, was satisfied, and agreed to proceed.     HPI: Kathy Castro is a 60 y.o. female who presents for screening colonoscopy.  No prior colonoscopy.  Tolerated the prep.  No complaints today including chest pain or shortness of breath.  Past Medical History:  Diagnosis Date   Fibroids    History of kidney stones     Past Surgical History:  Procedure Laterality Date   DILATATION & CURETTAGE/HYSTEROSCOPY WITH MYOSURE  07/28/2014   endometrial polyps   HAND SURGERY      Prior to Admission medications   Medication Sig Start Date End Date Taking? Authorizing Provider  Sodium Sulfate-Mag Sulfate-KCl (SUTAB) [phone] MG TABS Take 1 kit by mouth as directed. 08/01/21  Yes Mailynn Everly, Lajuan Lines, MD  Vitamin D, Ergocalciferol, (DRISDOL) 1.25 MG (50000 UNIT) CAPS capsule Take 50,000 Units by mouth once a week. 04/17/21  Yes [provider]  ketoconazole (NIZORAL) 2 % shampoo Apply to scalp and let sit 3-5 minutes then rinse. 06/06/21   Warren Danes, PA-C    Current Outpatient Medications  Medication Sig Dispense Refill   Sodium Sulfate-Mag Sulfate-KCl (SUTAB) [phone] MG TABS Take 1 kit by mouth as directed. 24 tablet 0   Vitamin D, Ergocalciferol, (DRISDOL) 1.25 MG (50000 UNIT) CAPS capsule Take 50,000 Units by mouth once a week.     ketoconazole (NIZORAL) 2 % shampoo Apply to scalp and let sit 3-5 minutes then rinse. 120 mL 5   Current Facility-Administered Medications  Medication Dose Route Frequency Provider Last Rate  Last Admin   0.9 %  sodium chloride infusion  500 mL Intravenous Once Daneli Butkiewicz, Lajuan Lines, MD        Allergies as of 08/15/2021 - Review Complete 08/15/2021  Allergen Reaction Noted   Other Hives, Shortness Of Breath, and Swelling 06/13/2011   Milk-related compounds Other (See Comments) 06/13/2011    Family History  Problem Relation Age of Onset   Diabetes Paternal Grandmother    Colon cancer Neg Hx    Rectal cancer Neg Hx    Stomach cancer Neg Hx    Esophageal cancer Neg Hx     Social History   Socioeconomic History   Marital status: Married    Spouse name: Not on file   Number of children: Not on file   Years of education: Not on file   Highest education level: Not on file  Occupational History   Not on file  Tobacco Use   Smoking status: Former    Packs/day: 0.10    Years: 20.00    Pack years: 2.00    Types: Cigarettes    Quit date: 10/22/2011    Years since quitting: 9.8   Smokeless tobacco: Never  Vaping Use   Vaping Use: Never used  Substance and Sexual Activity   Alcohol use: Yes    Alcohol/week: 5.0 standard drinks    Types: 5 Glasses of wine per week    Comment: wine   Drug use: No   Sexual activity: Yes    Partners: Male    Comment:  vasectomy  Other Topics Concern   Not on file  Social History Narrative   Not on file   Social Determinants of Health   Financial Resource Strain: Not on file  Food Insecurity: Not on file  Transportation Needs: Not on file  Physical Activity: Not on file  Stress: Not on file  Social Connections: Not on file  Intimate Partner Violence: Not on file    Physical Exam: Vital signs in last 24 hours: '@BP'  130/77   Pulse 78   Temp (!) 97.5 F (36.4 C)   Ht '5\' 5"'  (1.651 m)   Wt 195 lb (88.5 kg)   LMP 02/03/2014 Comment: continous bleeding  SpO2 100%   BMI 32.45 kg/m  GEN: NAD EYE: Sclerae anicteric ENT: MMM CV: Non-tachycardic Pulm: CTA b/l GI: Soft, NT/ND NEURO:  Alert & Oriented x 3   Zenovia Jarred, MD [facility]  08/15/2021 9:08 AM

## 2021-08-15 NOTE — Progress Notes (Signed)
Called to room to assist during endoscopic procedure.  Patient ID and intended procedure confirmed with present staff. Received instructions for my participation in the procedure from the performing physician.  

## 2021-08-17 ENCOUNTER — Telehealth: Payer: Self-pay | Admitting: *Deleted

## 2021-08-17 DIAGNOSIS — E559 Vitamin D deficiency, unspecified: Secondary | ICD-10-CM | POA: Diagnosis not present

## 2021-08-17 DIAGNOSIS — Z13228 Encounter for screening for other metabolic disorders: Secondary | ICD-10-CM | POA: Diagnosis not present

## 2021-08-17 NOTE — Telephone Encounter (Signed)
  Follow up Call-  Call back number 08/15/2021  Post procedure Call Back phone  # [phone]  Permission to leave phone message Yes  Some recent data might be hidden     Patient questions:  Do you have a fever, pain , or abdominal swelling? No. Pain Score  0 *  Have you tolerated food without any problems? Yes.    Have you been able to return to your normal activities? Yes.    Do you have any questions about your discharge instructions: Diet   No. Medications  No. Follow up visit  No.  Do you have questions or concerns about your Care? No.  Actions: * If pain score is 4 or above: No action needed, pain <4.

## 2021-08-20 DIAGNOSIS — Z0000 Encounter for general adult medical examination without abnormal findings: Secondary | ICD-10-CM | POA: Diagnosis not present

## 2021-08-21 ENCOUNTER — Encounter: Payer: Self-pay | Admitting: Internal Medicine

## 2021-08-23 ENCOUNTER — Ambulatory Visit
Admission: RE | Admit: 2021-08-23 | Discharge: 2021-08-23 | Disposition: A | Discharge status: Home / Self Care | Payer: BC Managed Care – PPO | Source: Ambulatory Visit | Attending: *Deleted | Admitting: *Deleted

## 2021-08-23 DIAGNOSIS — Z1231 Encounter for screening mammogram for malignant neoplasm of breast: Secondary | ICD-10-CM

## 2021-10-18 DIAGNOSIS — J018 Other acute sinusitis: Secondary | ICD-10-CM | POA: Diagnosis not present

## 2022-04-12 IMAGING — MG MM DIGITAL SCREENING BILAT W/ TOMO AND CAD
8 series · 8 of 24 positions shown · non-contrast
Comparison: Previous exam(s).

CLINICAL DATA: Screening.

EXAM:
DIGITAL SCREENING BILATERAL MAMMOGRAM WITH TOMOSYNTHESIS AND CAD
TECHNIQUE: Bilateral screening digital craniocaudal and mediolateral oblique
mammograms were obtained. Bilateral screening digital breast
tomosynthesis was performed. The images were evaluated with
computer-aided detection.

[R CC synth-2D]
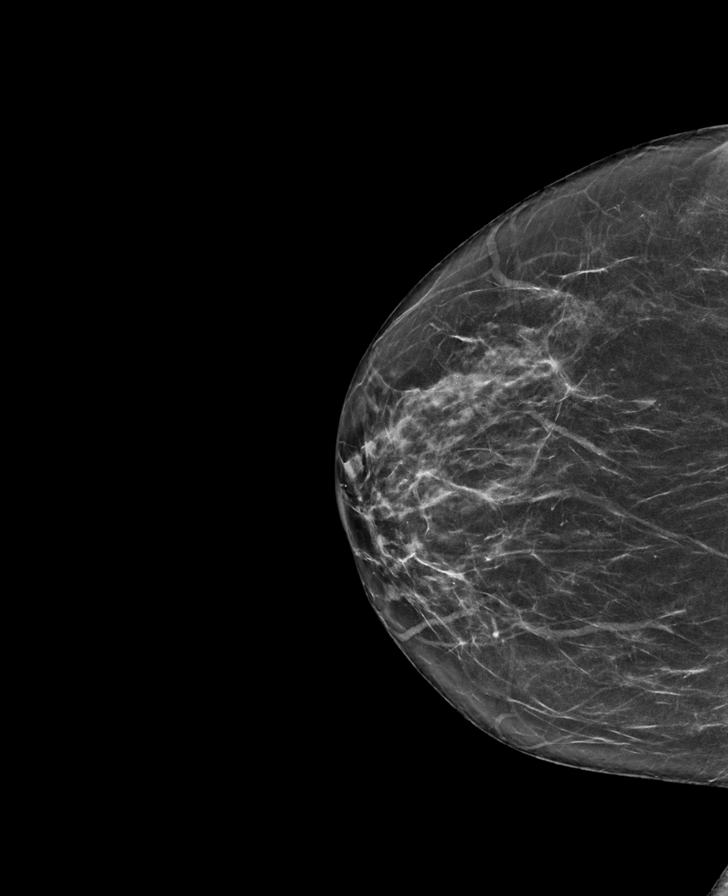

[L MLO synth-2D]
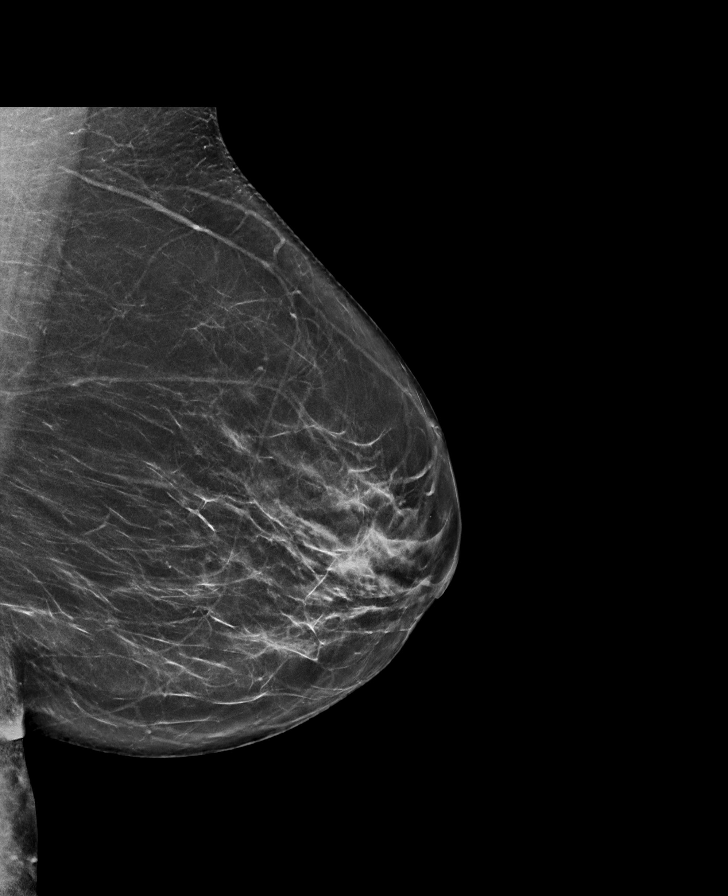

[R MLO synth-2D]
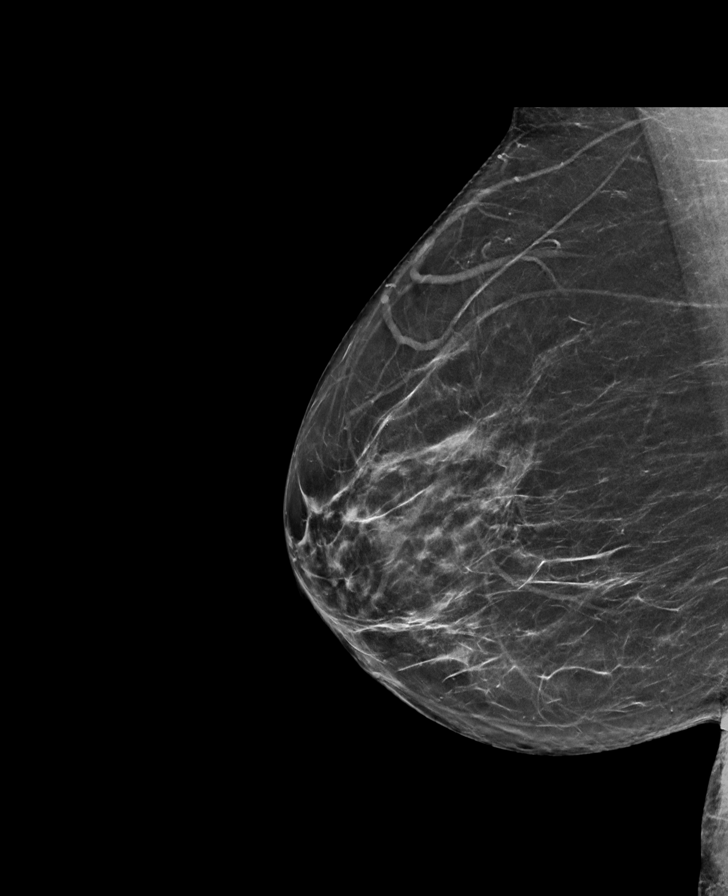

[L CC synth-2D]
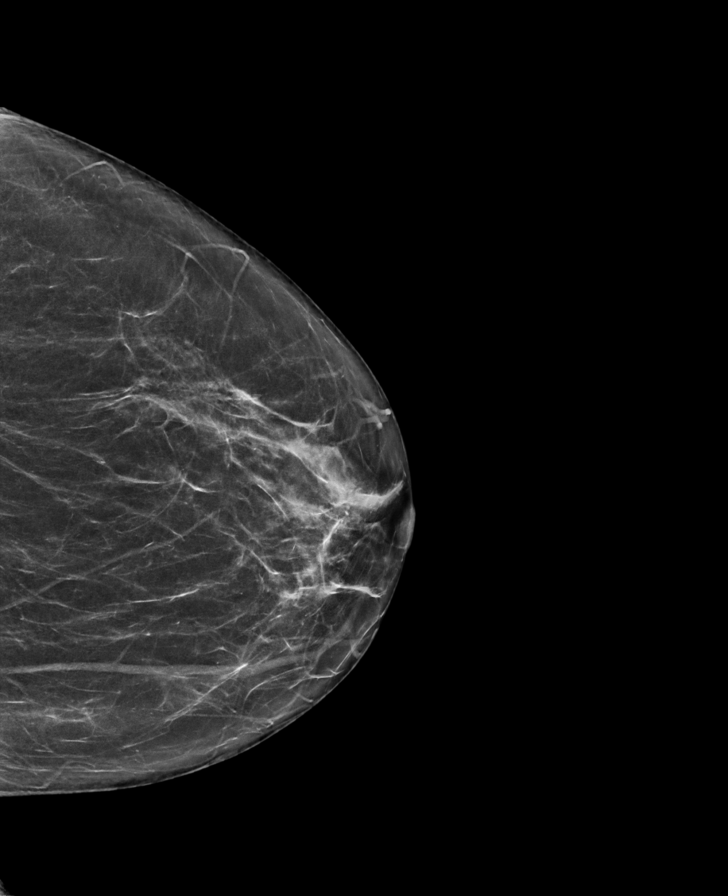

[L CC tomo · tomo slice 35/68.0]
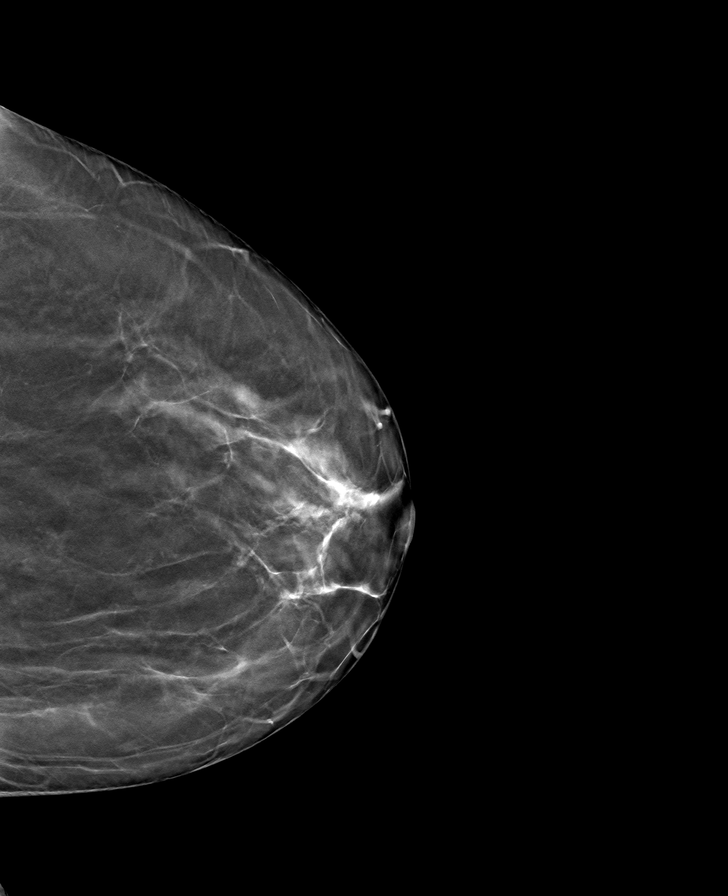

[L MLO tomo · tomo slice 37/72.0]
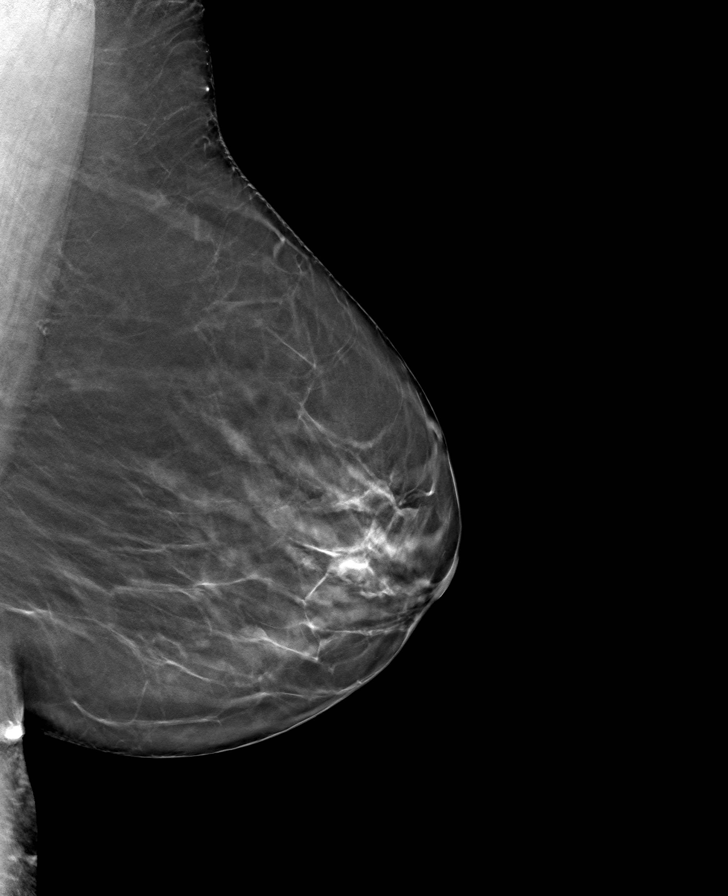

[R MLO tomo · tomo slice 36/71.0]
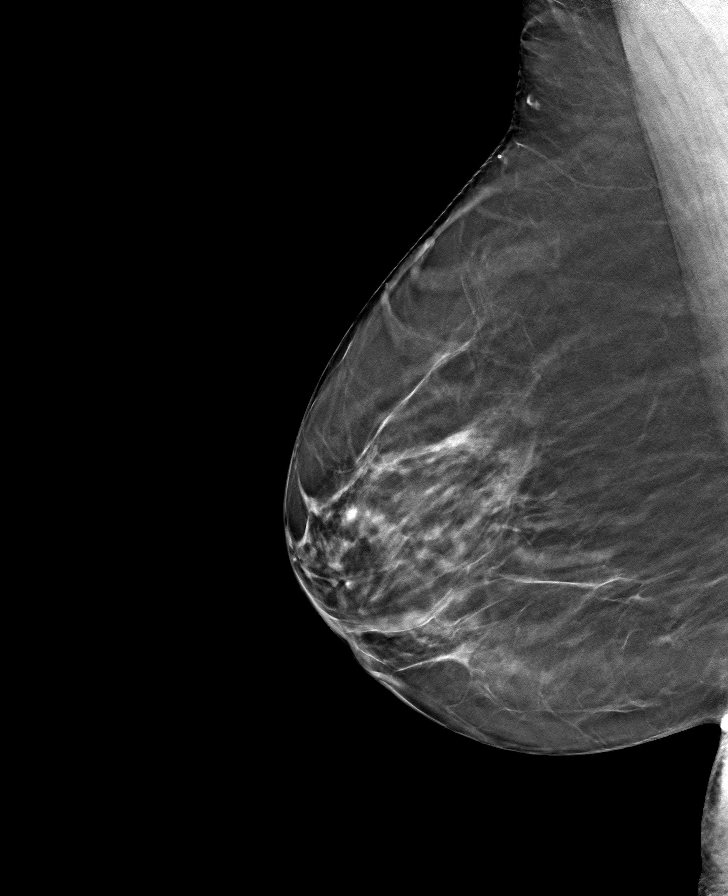

[R CC tomo · tomo slice 34/67.0]
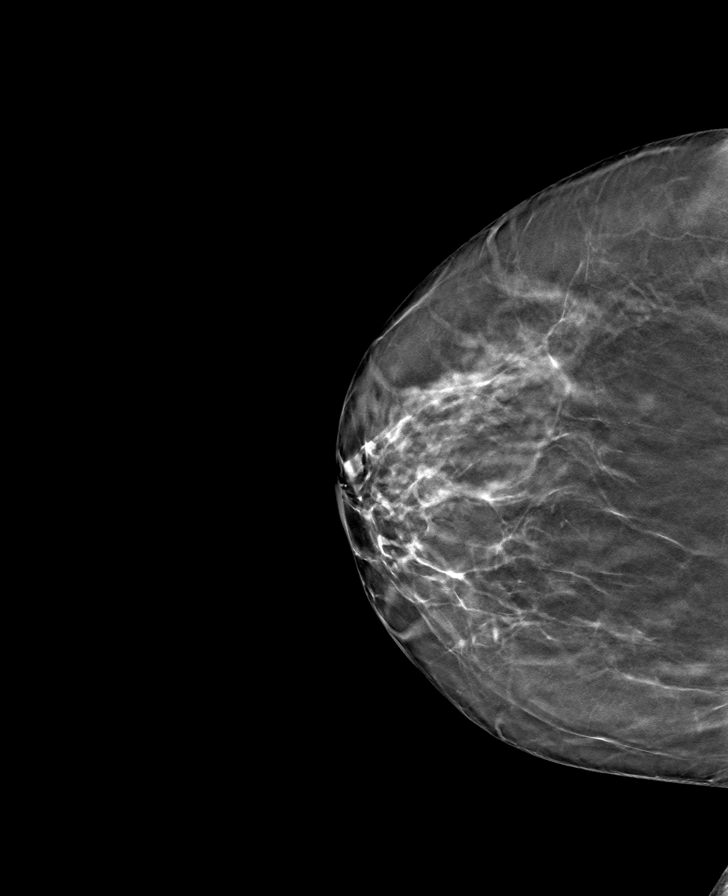

[8 of 24 positions shown; findings below may reference images not displayed]

ACR Breast Density Category c: The breast tissue is heterogeneously
dense, which may obscure small masses.
FINDINGS: There are no findings suspicious for malignancy.
IMPRESSION: No mammographic evidence of malignancy. A result letter of this
screening mammogram will be mailed directly to the patient.

RECOMMENDATION:
Screening mammogram in one year. (Code:Q3-W-BC3)

BI-RADS CATEGORY  1: Negative.

## 2023-08-18 ENCOUNTER — Emergency Department (HOSPITAL_COMMUNITY)
Admission: EM | Admit: 2023-08-18 | Discharge: 2023-08-18 | Disposition: A | Discharge status: Home / Self Care | Payer: BC Managed Care – PPO | Attending: Emergency Medicine | Admitting: Emergency Medicine

## 2023-08-18 ENCOUNTER — Encounter (HOSPITAL_COMMUNITY): Payer: Self-pay | Admitting: Emergency Medicine

## 2023-08-18 DIAGNOSIS — Z711 Person with feared health complaint in whom no diagnosis is made: Secondary | ICD-10-CM | POA: Diagnosis present

## 2023-08-18 LAB — CBC
HCT: 45.7 % (ref 36.0–46.0)
Hemoglobin: 15 g/dL (ref 12.0–15.0)
MCH: 30.9 pg (ref 26.0–34.0)
MCHC: 32.8 g/dL (ref 30.0–36.0)
MCV: 94 fL (ref 80.0–100.0)
Platelets: 246 10*3/uL (ref 150–400)
RBC: 4.86 MIL/uL (ref 3.87–5.11)
RDW: 13 % (ref 11.5–15.5)
WBC: 7.1 10*3/uL (ref 4.0–10.5)
nRBC: 0 % (ref 0.0–0.2)

## 2023-08-18 LAB — COMPREHENSIVE METABOLIC PANEL
ALT: 22 U/L (ref 0–44)
AST: 23 U/L (ref 15–41)
Albumin: 4 g/dL (ref 3.5–5.0)
Alkaline Phosphatase: 92 U/L (ref 38–126)
Anion gap: 8 (ref 5–15)
BUN: 10 mg/dL (ref 6–20)
CO2: 24 mmol/L (ref 22–32)
Calcium: 9.2 mg/dL (ref 8.9–10.3)
Chloride: 105 mmol/L (ref 98–111)
Creatinine, Ser: 0.75 mg/dL (ref 0.44–1.00)
GFR, Estimated: >60 mL/min (ref >60)
Glucose, Bld: 103 mg/dL — ABNORMAL HIGH (ref 70–99)
Potassium: 3.6 mmol/L (ref 3.5–5.1)
Sodium: 137 mmol/L (ref 135–145)
Total Bilirubin: 0.4 mg/dL (ref 0.3–1.2)
Total Protein: 7.1 g/dL (ref 6.5–8.1)

## 2023-08-18 NOTE — ED Provider Notes (Signed)
EMERGENCY DEPARTMENT AT [facility] Provider Note   CSN: [id number] Arrival date & time: 08/18/23  1130     History  Chief Complaint  Patient presents with   Abnormal Lab    Kathy Castro is a 62 y.o. female.  With history of uterine fibroids presenting to the ED for evaluation of abnormal lab.  She had her yearly physical on Friday and had blood work done at that time.  She was called by her primary care provider at [facility] today and encouraged to come to the emergency department as she had a sodium level of 165.  She felt normal on Friday and feels at her baseline today.  She had some diarrhea over the weekend but otherwise denies any symptoms.  She is eating and drinking like normal.  No recent changes to her medicines.  No abdominal pain, chest pain, shortness of breath.   Abnormal Lab      Home Medications Prior to Admission medications   Medication Sig Start Date End Date Taking? Authorizing Provider  ketoconazole (NIZORAL) 2 % shampoo Apply to scalp and let sit 3-5 minutes then rinse. 06/06/21   Glyn Ade, PA-C  Sodium Sulfate-Mag Sulfate-KCl (SUTAB) [phone] MG TABS Take 1 kit by mouth as directed. 08/01/21   Pyrtle, Carie Caddy, MD  Vitamin D, Ergocalciferol, (DRISDOL) 1.25 MG (50000 UNIT) CAPS capsule Take 50,000 Units by mouth once a week. 04/17/21   [provider]      Allergies    Other and Milk-related compounds    Review of Systems   Review of Systems  All other systems reviewed and are negative.   Physical Exam Updated Vital Signs BP (!) 170/99   Pulse 93   Temp 97.7 F (36.5 C)   Resp 18   LMP 02/03/2014 Comment: continous bleeding  SpO2 100%  Physical Exam Vitals and nursing note reviewed.  Constitutional:      General: She is not in acute distress.    Appearance: She is well-developed.     Comments: Resting comfortably in chair  HENT:     Head: Normocephalic and atraumatic.  Eyes:      Conjunctiva/sclera: Conjunctivae normal.  Cardiovascular:     Rate and Rhythm: Normal rate and regular rhythm.     Heart sounds: No murmur heard. Pulmonary:     Effort: Pulmonary effort is normal. No respiratory distress.     Breath sounds: Normal breath sounds.  Abdominal:     Palpations: Abdomen is soft.     Tenderness: There is no abdominal tenderness.  Musculoskeletal:        General: No swelling.     Cervical back: Neck supple.  Skin:    General: Skin is warm and dry.     Capillary Refill: Capillary refill takes less than 2 seconds.  Neurological:     General: No focal deficit present.     Mental Status: She is alert and oriented to person, place, and time.  Psychiatric:        Mood and Affect: Mood normal.     ED Results / Procedures / Treatments   Labs (all labs ordered are listed, but only abnormal results are displayed) Labs Reviewed  COMPREHENSIVE METABOLIC PANEL - Abnormal; Notable for the following components:      Result Value   Glucose, Bld 103 (*)    All other components within normal limits  CBC    EKG None  Radiology No results found.  Procedures Procedures    Medications Ordered in ED Medications - No data to display  ED Course/ Medical Decision Making/ A&P                                 Medical Decision Making Amount and/or Complexity of Data Reviewed Labs: ordered.  This patient presents to the ED for concern of abnormal lab, this involves an extensive number of treatment options, and is a complaint that carries with it a high risk of complications and morbidity.   My initial workup includes Basic labs  Additional history obtained from: Nursing notes from this visit.  I ordered, reviewed and interpreted labs which include: CMP, CBC.  Labs reassuring.  Specifically, sodium of 137.  All other electrolytes within normal limits  Afebrile, hemodynamically stable.  62 year old female presenting to the ED for evaluation of abnormal lab.   Was told she had a sodium of 165.  This was drawn on Friday.  She reports feeling at her baseline.  She states they did have some difficulties drawing her blood on Friday.  Overall I suspect a lab error.  She appears very well on physical exam.  She was encouraged to follow-up with her primary care provider as needed.  She was given return precautions.  Stable discharge.  At this time there does not appear to be any evidence of an acute emergency medical condition and the patient appears stable for discharge with appropriate outpatient follow up. Diagnosis was discussed with patient who verbalizes understanding of care plan and is agreeable to discharge. I have discussed return precautions with patient who verbalizes understanding. Patient encouraged to follow-up with their PCP within 1 week. All questions answered.  Note: Portions of this report may have been transcribed using voice recognition software. Every effort was made to ensure accuracy; however, inadvertent computerized transcription errors may still be present.        Final Clinical Impression(s) / ED Diagnoses Final diagnoses:  Feared complaint without diagnosis    Rx / DC Orders ED Discharge Orders     None         Michelle Piper, Cordelia Poche 08/18/23 1304    Pricilla Loveless, MD 08/19/23 1501

## 2023-08-18 NOTE — ED Triage Notes (Signed)
Pt here sent from MD office to recheck her sodium , her level was high from a blood draw , pt has no complaints

## 2023-08-18 NOTE — Discharge Instructions (Signed)
You have been seen today for your complaint of elevated sodium level. Your lab work revealed a normal sodium level today. Follow up with: Your primary care provider as needed Please seek immediate medical care if you develop any of the following symptoms: Any new or worsening symptoms At this time there does not appear to be the presence of an emergent medical condition, however there is always the potential for conditions to change. Please read and follow the below instructions.  Do not take your medicine if  develop an itchy rash, swelling in your mouth or lips, or difficulty breathing; call 911 and seek immediate emergency medical attention if this occurs.  You may review your lab tests and imaging results in their entirety on your MyChart account.  Please discuss all results of fully with your primary care provider and other specialist at your follow-up visit.  Note: Portions of this text may have been transcribed using voice recognition software. Every effort was made to ensure accuracy; however, inadvertent computerized transcription errors may still be present.
